# Patient Record
Sex: Female | Born: 1980
Health system: Southern US, Community
[De-identification: ages and names within clinical notes are randomized; demographics above are authoritative.]

## PROBLEM LIST (undated history)

## (undated) DIAGNOSIS — F32A Depression, unspecified: Secondary | ICD-10-CM

## (undated) DIAGNOSIS — T7840XA Allergy, unspecified, initial encounter: Secondary | ICD-10-CM

## (undated) DIAGNOSIS — J45909 Unspecified asthma, uncomplicated: Secondary | ICD-10-CM

## (undated) DIAGNOSIS — F419 Anxiety disorder, unspecified: Secondary | ICD-10-CM

## (undated) DIAGNOSIS — E785 Hyperlipidemia, unspecified: Secondary | ICD-10-CM

## (undated) DIAGNOSIS — G43909 Migraine, unspecified, not intractable, without status migrainosus: Secondary | ICD-10-CM

## (undated) DIAGNOSIS — Z8619 Personal history of other infectious and parasitic diseases: Secondary | ICD-10-CM

## (undated) DIAGNOSIS — F329 Major depressive disorder, single episode, unspecified: Secondary | ICD-10-CM

## (undated) HISTORY — DX: Allergy, unspecified, initial encounter: T78.40XA

## (undated) HISTORY — DX: Migraine, unspecified, not intractable, without status migrainosus: G43.909

## (undated) HISTORY — DX: Personal history of other infectious and parasitic diseases: Z86.19

## (undated) HISTORY — DX: Hyperlipidemia, unspecified: E78.5

## (undated) HISTORY — DX: Major depressive disorder, single episode, unspecified: F32.9

## (undated) HISTORY — DX: Unspecified asthma, uncomplicated: J45.909

## (undated) HISTORY — PX: WISDOM TOOTH EXTRACTION: SHX21

## (undated) HISTORY — DX: Depression, unspecified: F32.A

## (undated) HISTORY — DX: Anxiety disorder, unspecified: F41.9

---

## 2013-01-31 ENCOUNTER — Emergency Department (INDEPENDENT_AMBULATORY_CARE_PROVIDER_SITE_OTHER)
Admission: EM | Admit: 2013-01-31 | Discharge: 2013-01-31 | Disposition: A | Discharge status: Home / Self Care | Payer: Self-pay | Source: Home / Self Care | Attending: Family Medicine | Admitting: Family Medicine

## 2013-01-31 ENCOUNTER — Encounter (HOSPITAL_COMMUNITY): Payer: Self-pay | Admitting: Emergency Medicine

## 2013-01-31 DIAGNOSIS — M5431 Sciatica, right side: Secondary | ICD-10-CM

## 2013-01-31 DIAGNOSIS — M5430 Sciatica, unspecified side: Secondary | ICD-10-CM

## 2013-01-31 MED ORDER — TRAMADOL HCL 50 MG PO TABS: 50.0000 mg | ORAL_TABLET | Freq: Four times a day (QID) | ORAL | Status: DC | PRN

## 2013-01-31 MED ORDER — ONDANSETRON 4 MG PO TBDP: 4.0000 mg | ORAL_TABLET | Freq: Three times a day (TID) | ORAL | Status: DC | PRN

## 2013-01-31 MED ORDER — CYCLOBENZAPRINE HCL 10 MG PO TABS: 10.0000 mg | ORAL_TABLET | Freq: Three times a day (TID) | ORAL | Status: DC | PRN
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
## 2013-01-31 NOTE — Discharge Instructions (Signed)
Rest, avoid heavy lifting and strenuous exercise or activity until pain improves. Take medications as directed.Add Ibuprofen 800 mg three times a day for 4-5 days as well. Call Monday to arrange follow up with Dr Mardelle Matte for further evaluation of your injur and incidental Ct findings from [facility].    Sciatica Sciatica is pain, weakness, numbness, or tingling along your sciatic nerve. The nerve starts in the lower back and runs down the back of each leg. Nerve damage or certain conditions pinch or put pressure on the sciatic nerve. This causes the pain, weakness, and other discomforts of sciatica. HOME CARE   Only take medicine as told by your doctor.  Apply ice to the affected area for 20 minutes. Do this 3 4 times a day for the first 48 72 hours. Then try heat in the same way.  Exercise, stretch, or do your usual activities if these do not make your pain worse.  Go to physical therapy as told by your doctor.  Keep all doctor visits as told.  Do not wear high heels or shoes that are not supportive.  Get a firm mattress if your mattress is too soft to lessen pain and discomfort. GET HELP RIGHT AWAY IF:   You cannot control when you poop (bowel movement) or pee (urinate).  You have more weakness in your lower back, lower belly (pelvis), butt (buttocks), or legs.  You have redness or puffiness (swelling) of your back.  You have a burning feeling when you pee.  You have pain that gets worse when you lie down.  You have pain that wakes you from your sleep.  Your pain is worse than past pain.  Your pain lasts longer than 4 weeks.  You are suddenly losing weight without reason. MAKE SURE YOU:   Understand these instructions.  Will watch this condition.  Will get help right away if you are not doing well or get worse. Document Released: 09/27/2007 Document Revised: 06/19/2011 Document Reviewed: 04/29/2011 [facility] Patient Information 2014 [place].
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
## 2013-02-02 NOTE — ED Provider Notes (Signed)
Medical screening examination/treatment/procedure(s) were performed by a resident physician or non-physician practitioner and as the supervising physician I was immediately available for consultation/collaboration.  Lynne Leader, MD    Gregor Hams, MD 02/02/13 [phone]

## 2014-04-15 ENCOUNTER — Ambulatory Visit: Payer: Self-pay | Admitting: Physical Therapy

## 2014-04-21 ENCOUNTER — Encounter: Payer: Self-pay | Admitting: Physical Therapy

## 2014-04-21 ENCOUNTER — Ambulatory Visit: Payer: BLUE CROSS/BLUE SHIELD | Attending: Gynecology | Admitting: Physical Therapy

## 2014-04-21 DIAGNOSIS — F419 Anxiety disorder, unspecified: Secondary | ICD-10-CM | POA: Diagnosis not present

## 2014-04-21 DIAGNOSIS — N941 Dyspareunia: Secondary | ICD-10-CM | POA: Insufficient documentation

## 2014-04-21 DIAGNOSIS — M6289 Other specified disorders of muscle: Secondary | ICD-10-CM

## 2014-04-21 DIAGNOSIS — N8184 Pelvic muscle wasting: Secondary | ICD-10-CM | POA: Diagnosis present

## 2014-04-21 DIAGNOSIS — R29898 Other symptoms and signs involving the musculoskeletal system: Secondary | ICD-10-CM
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
## 2014-04-21 NOTE — Patient Instructions (Addendum)
Instruction and counseling in HEP and stress/anxiety management techniques:  1. Supine diaphragmatic breathing: lying on back with knees bent, one hand on chest the other on abdomen. Inhale belly rises, exhale gently falls.   Perform 5-10 minutes 1-2x/day 2. Supine hip ER stretch: lying on back with feet together and knees apart, exhale, contract pelvic floor and bring knees slightly closer together, inhale, let knees fall towards the ground.  Repeat for 10-20 breaths 1-2x/day.  3. Child's pose with wide knees and lateral rib breathing,  Repeat 10-20 breaths, 1-2x/day. 4. Legs up the wall with diaphragmatic breathing. Perform 5-10 minutes 1-2x/day.   Patient able to return demonstration with above stress management technique correctly

## 2014-04-26 ENCOUNTER — Encounter: Payer: Self-pay | Admitting: Physical Therapy

## 2014-04-26 ENCOUNTER — Ambulatory Visit: Payer: BLUE CROSS/BLUE SHIELD | Admitting: Physical Therapy

## 2014-04-26 DIAGNOSIS — N8184 Pelvic muscle wasting: Secondary | ICD-10-CM | POA: Diagnosis not present

## 2014-04-26 DIAGNOSIS — M6289 Other specified disorders of muscle: Secondary | ICD-10-CM

## 2014-04-26 DIAGNOSIS — R29898 Other symptoms and signs involving the musculoskeletal system: Secondary | ICD-10-CM

## 2014-04-26 NOTE — Patient Instructions (Addendum)
Instruction and counseling in HEP and stress/anxiety management techniques:  1. Supine diaphragmatic breathing: lying on back with knees bent, one hand on chest the other on abdomen. Inhale belly rises, exhale gently falls.   Perform 5-10 minutes 1-2x/day  Diaphragmatic Breathing - Supine   Copyright  VHI. All rights reserved.      2. Supine hip ER stretch: lying on back with feet together and knees apart, exhale, contract pelvic floor and bring knees slightly closer together, inhale, let knees fall towards the ground.  Repeat for 10-20 breaths 1-2x/day.  Butterfly, Supine   Copyright  VHI. All rights reserved.   3. Child's pose with wide knees and lateral rib breathing,  Repeat 10-20 breaths, 1-2x/day. BACK: Child's Pose (Sciatica)   Copyright  VHI. All rights reserved.   4. Legs up the wall with diaphragmatic breathing. Perform 5-10 minutes 1-2x/day. Have 2 legs on the wall Hamstrings / Gastrocnemius    Copyright  VHI. All rights reserved.  Chest Flexibility: Downward Dog Pose (Wall)   Standing with hands at shoulder height anchored on wall, step back into pose. Hold for _30___ breaths. Repeat __2__ times.  Piriformis Stretch, Sitting   Sit, one ankle on opposite knee, same-side hand on crossed knee. Push down on knee, keeping spine straight. Lean torso forward, with flat back, until tension is felt in hamstrings and gluteals of crossed-leg side. Hold _30__ seconds.  Repeat __2_ times per session. Do _1__ sessions per day. Both legs.  Copyright  VHI. All rights reserved.   On Elbows (Prone)   Rise up on elbows as high as possible, keeping hips on floor. Hold _30___ seconds. Repeat __2__ times per set. Do __1__ sets per session. Do _1___ sessions per day.  http://orth.exer.us/93   Copyright  VHI. All rights reserved.  STRETCHING THE PELVIC FLOOR MUSCLES NO DILATOR  Supplies . Vaginal lubricant . Mirror (optional) . Gloves  (optional) Positioning . Start in a semi-reclined position with your head propped up. Bend your knees and place your thumb or finger at the vaginal opening. Procedure . Apply a moderate amount of lubricant on the outer skin of your vagina, the labia minora.  Apply additional lubricant to your finger. Marland Kitchen Spread the skin away from the vaginal opening. Place the end of your finger at the opening. . Do a maximum contraction of the pelvic floor muscles. Tighten the vagina and the anus maximally and relax. . When you know they are relaxed, gently and slowly insert your finger into your vagina, directing your finger slightly downward, for 2-3 inches of insertion. . Relax and stretch the 6 o'clock position . Hold each stretch for _2 min__ and repeat __1_ time with rest breaks of _1__ seconds between each stretch. . Repeat the stretching in the 4 o'clock and 8 o'clock positions. . Total time should be _6__ minutes, _1__ x per day.  Note the amount of theme your were able to achieve and your tolerance to your finger in your vagina. Once you have accomplished the techniques you may try them in standing with one foot resting on the tub, or in other positions.  This is a good stretch to do in the shower if you don't need to use lubricant.  Also massage around the perineum, around the vagina, use to hand to stretch the area.    Patient able to return demonstration correctly with above exercises.

## 2014-04-26 NOTE — Therapy (Signed)
[facility] [address] [place], [place], [address] Phone: [phone]   Fax:  [phone]  Physical Therapy Treatment  Patient Details  Name: Jennifer Moody MRN: [id number] Date of Birth: 06/15/1980 Referring Provider:  West Pugh, NP  Encounter Date: 04/26/2014      PT End of Session - 04/26/14 1026    Visit Number 2   Date for PT Re-Evaluation 07/14/14   PT Start Time 0930   PT Stop Time 1022   PT Time Calculation (min) 52 min   Activity Tolerance Patient tolerated treatment well   Behavior During Therapy [facility] for tasks assessed/performed;Anxious      History reviewed. No pertinent past medical history.  History reviewed. No pertinent past surgical history.  There were no vitals filed for this visit.  Visit Diagnosis:  PFD (pelvic floor dysfunction)  Tight unbalanced muscles      Subjective Assessment - 04/26/14 0936    Subjective The stretches have been helping. I like the yoga poses. End of period pain with tampons with putting them in and taking out.    Pertinent History Anxiety   Patient Stated Goals reduce pain with intercourse   Currently in Pain? Yes   Pain Score 7    Pain Location Back   Pain Orientation Lower   Pain Descriptors / Indicators Aching;Dull  tightness in back of legs   Pain Type Chronic pain   Pain Onset More than a month ago   Aggravating Factors  intercourse, using a tampon, vaginal exam   Pain Relieving Factors no intercourse   Multiple Pain Sites No                         [facility] Adult PT Treatment/Exercise - 04/26/14 0001    Manual Therapy   Manual Therapy Joint mobilization;Massage   Joint Mobilization muscle energy to correct right anteriorly rotated ilium and sacrum rotated right.  leveled after correction   Massage to abdominal muscles, diaphragm, and bil. psoas                PT Education - 04/26/14 1025    Education provided  Yes   Education Details flexibility exercises, performing soft tissue work to the pelvic floor externally, having her boyfriend perform external soft tissue work.    Person(s) Educated Patient   Methods Explanation;Demonstration;Tactile cues;Verbal cues;Handout   Comprehension Returned demonstration;Verbalized understanding          PT Short Term Goals - 04/26/14 1031    PT SHORT TERM GOAL #1   Title understand  how to perform diaphragmatic breathing correclty without using accessory muscles.    Time 4   Period Weeks   Status On-going  uses chest breathing   PT SHORT TERM GOAL #2   Title understand how to perform self soft tissue work to the perineum   Time 4   Period Weeks   Status New  just learning how to perform   PT SHORT TERM GOAL #3   Title understand how to bulge the pelvic floor to improve relaxation   Time 4   Period Weeks   Status New  just started to learn   PT SHORT TERM GOAL #4   Title pelvis stay in correct position due to improve corestability   Time 4   Period Weeks   Status New  not corrected           PT Long Term Goals -  04/21/14 1343    PT LONG TERM GOAL #1   Title ability to have intercourse with 75% decreased in pain   Time 12   Period Weeks   Status New   PT LONG TERM GOAL #2   Title ability to wear a tampon with 75% decreased in pain   Time 12   Period Weeks   Status New   PT LONG TERM GOAL #3   Title understand how to use dilator to expand the vaginal canal to improve comfort with intercourse   Time 12   Period Weeks   Status New   PT LONG TERM GOAL #4   Title Marinoff scale is 1/3   Time 12   Period Weeks   Status New               Plan - 04/26/14 1027    Clinical Impression Statement Patient is a 34 year old female with pain in the low back and pelvic floor.  Patient is learning ways to manage her pain with stress relief techniques, flexiblity exercises and deep breathing.  patient reports the technique s are helping  her relax and reduce pain.  Patients pelvis was not in alignment and  was correctr with muscle energy techniques.  Patient diaphragm has decreased mobility with trigger points contributing to her pelvic floor. Patient shows decreased muscle control with bridges.    Pt will benefit from skilled therapeutic intervention in order to improve on the following deficits Pain;Decreased strength;Decreased mobility;Increased muscle spasms;Decreased activity tolerance;Increased fascial restricitons;Postural dysfunction;Decreased endurance;Decreased coordination;Decreased range of motion;Impaired flexibility   Rehab Potential Good   PT Frequency 1x / week   PT Duration 12 weeks   PT Treatment/Interventions Moist Heat;Therapeutic activities;Patient/family education;Passive range of motion;Therapeutic exercise;Biofeedback;Ultrasound;Manual techniques;Cryotherapy;Electrical Stimulation;Neuromuscular re-education;Functional mobility training   PT Next Visit Plan downtraining of pelvic floor, core contraction to improve pelvic stability, soft tissue work to diaphragm.    PT Home Exercise Plan current HEP   Consulted and Agree with Plan of Care Patient        Problem List There are no active problems to display for this patient.   Damisha Wolff,PT 04/26/2014, 10:36 AM  [facility] [address] [place], [place], [address] Phone: [phone]   Fax:  [phone]

## 2014-05-05 ENCOUNTER — Encounter: Payer: BLUE CROSS/BLUE SHIELD | Admitting: Physical Therapy

## 2014-05-13 ENCOUNTER — Ambulatory Visit: Payer: BLUE CROSS/BLUE SHIELD | Attending: Gynecology | Admitting: Physical Therapy

## 2014-05-13 ENCOUNTER — Encounter: Payer: Self-pay | Admitting: Physical Therapy

## 2014-05-13 DIAGNOSIS — F419 Anxiety disorder, unspecified: Secondary | ICD-10-CM | POA: Diagnosis not present

## 2014-05-13 DIAGNOSIS — M6289 Other specified disorders of muscle: Secondary | ICD-10-CM

## 2014-05-13 DIAGNOSIS — N941 Dyspareunia: Secondary | ICD-10-CM | POA: Diagnosis not present

## 2014-05-13 DIAGNOSIS — N8184 Pelvic muscle wasting: Secondary | ICD-10-CM | POA: Insufficient documentation

## 2014-05-13 DIAGNOSIS — R29898 Other symptoms and signs involving the musculoskeletal system: Secondary | ICD-10-CM | POA: Diagnosis not present

## 2014-05-13 NOTE — Therapy (Addendum)
[facility] [address] [place], [place], [address] Phone: [phone]   Fax:  [phone]  Physical Therapy Treatment  Patient Details  Name: Jennifer Moody MRN: [id number] Date of Birth: 19-Jul-1980 Referring Provider:  West Pugh, NP  Encounter Date: 05/13/2014      PT End of Session - 05/13/14 1232    Visit Number 3   Date for PT Re-Evaluation 07/14/14   PT Start Time 1230   PT Stop Time 1300   PT Time Calculation (min) 30 min   Activity Tolerance Patient tolerated treatment well   Behavior During Therapy [facility] for tasks assessed/performed;Anxious      History reviewed. No pertinent past medical history.  History reviewed. No pertinent past surgical history.  There were no vitals filed for this visit.  Visit Diagnosis:  PFD (pelvic floor dysfunction)  Tight unbalanced muscles      Subjective Assessment - 05/13/14 1233    Subjective I am feeling better. I was able to have intercourse without pain.    Pertinent History Anxiety   Limitations Other (comment)  wearing tampons and vaginal exam   Patient Stated Goals reduce pain with intercourse   Currently in Pain? No/denies   Multiple Pain Sites No            [facility] PT Assessment - 05/13/14 0001    Assessment   Medical Diagnosis Dyspareunia   Observation/Other Assessments   Focus on Therapeutic Outcomes (FOTO)  0% limitation   AROM   Lumbar Extension full   Palpation   Palpation pelvis in correct alignment   Pelvic Compression   Findings Negative     Physical therapist instructed patient on using a tampon that is smaller for the end of her cycle. PT instructed patient to use guided meditation and deep breathing exercises prior to her vaginal exam.  Physical therapist emailed patient a link for guided meditation for her to follow.               Pelvic Floor Special Questions - 05/13/14 0001    Marinoff Scale no problems                    PT Education - 05/13/14 1304    Education provided Yes   Education Details Instruction on using guided meditation, using a smaller  tampon at end of her cycle,    Person(s) Educated Patient   Methods Explanation;Other (comment)  website for guided meditation   Comprehension Verbalized understanding          PT Short Term Goals - 05/13/14 1239    PT SHORT TERM GOAL #1   Title understand  how to perform diaphragmatic breathing correclty without using accessory muscles.    Time 4   Period Weeks   Status Achieved   PT SHORT TERM GOAL #2   Title understand how to perform self soft tissue work to the perineum   Time 4   Period Weeks   Status Achieved   PT SHORT TERM GOAL #3   Title understand how to bulge the pelvic floor to improve relaxation   Time 4   Period Weeks   Status Achieved   PT SHORT TERM GOAL #4   Title pelvis stay in correct position due to improve corestability   Time 4   Period Weeks   Status Achieved           PT Long Term Goals - 05/13/14 1245  PT LONG TERM GOAL #1   Title ability to have intercourse with 75% decreased in pain   Time 12   Period Weeks   Status Achieved   PT LONG TERM GOAL #2   Title ability to wear a tampon with 75% decreased in pain   Time 12   Period Weeks   Status Not Met  50% less   PT LONG TERM GOAL #3   Title understand how to use dilator to expand the vaginal canal to improve comfort with intercourse   Time 12   Period Weeks   Status Deferred  does not need one   PT LONG TERM GOAL #4   Title Marinoff scale is 1/3   Time 12   Period Weeks   Status Achieved               Plan - 05/13/14 1250    Clinical Impression Statement Patient is a 34 year old female with pelvic and low back pain.  Patient is able to have intercourse without pain and uses her breathing techniques with longer foreplay.  Patient has met all of her LTG's. Patient is able to wear tampons with 50% less pain.   Patient has pain during the light part of her cycle due to the dryness of the tampon.  Patient has tried the menstraul cup but was uncomfortable. Patient is not in need of the dilator due to having intercourse without pain. FOTO score is 0% limitation.    Pt will benefit from skilled therapeutic intervention in order to improve on the following deficits Pain;Decreased strength;Decreased mobility;Increased muscle spasms;Decreased activity tolerance;Increased fascial restricitons;Postural dysfunction;Decreased endurance;Decreased coordination;Decreased range of motion;Impaired flexibility   Rehab Potential Good   PT Treatment/Interventions Moist Heat;Therapeutic activities;Patient/family education;Passive range of motion;Therapeutic exercise;Biofeedback;Ultrasound;Manual techniques;Cryotherapy;Electrical Stimulation;Neuromuscular re-education;Functional mobility training   PT Next Visit Plan Discharge to HEP   PT Home Exercise Plan current HEP   Consulted and Agree with Plan of Care Patient        Problem List There are no active problems to display for this patient.   Jennifer Moody,PT 05/13/2014, 1:12 PM  [facility] [address], [place], [place], [address] Phone: [phone]   Fax:  [phone]  PHYSICAL THERAPY DISCHARGE SUMMARY  Visits from Start of Care: 3  Current functional level related to goals / functional outcomes: See above.  Unable to fully assess patient due to not returning after last visit.   Did not achieve tampon goal due to being 50% comfortable.  Remaining deficits: See above.    Education / Equipment: HEP  Plan: Patient agrees to discharge.  Patient goals were partially met. Patient is being discharged due to not returning since the last visit. Thank you for the referral.Jennifer Moody, PT 11/24/2014 9:34 AM   ?????

## 2014-05-13 NOTE — Patient Instructions (Signed)
Bear Down   Exhaling, bear down as if to have a bowel movement. Repeat _3__ times. Do _1__ times a day.  Copyright  VHI. All rights reserved.

## 2015-03-02 ENCOUNTER — Emergency Department (HOSPITAL_COMMUNITY): Payer: BLUE CROSS/BLUE SHIELD

## 2015-03-02 ENCOUNTER — Emergency Department (HOSPITAL_COMMUNITY)
Admission: EM | Admit: 2015-03-02 | Discharge: 2015-03-02 | Disposition: A | Discharge status: Home / Self Care | Payer: BLUE CROSS/BLUE SHIELD | Attending: Emergency Medicine | Admitting: Emergency Medicine

## 2015-03-02 ENCOUNTER — Encounter (HOSPITAL_COMMUNITY): Payer: Self-pay | Admitting: Family Medicine

## 2015-03-02 DIAGNOSIS — Y998 Other external cause status: Secondary | ICD-10-CM | POA: Diagnosis not present

## 2015-03-02 DIAGNOSIS — Y9389 Activity, other specified: Secondary | ICD-10-CM | POA: Diagnosis not present

## 2015-03-02 DIAGNOSIS — R42 Dizziness and giddiness: Secondary | ICD-10-CM

## 2015-03-02 DIAGNOSIS — S0990XA Unspecified injury of head, initial encounter: Secondary | ICD-10-CM | POA: Diagnosis present

## 2015-03-02 DIAGNOSIS — R519 Headache, unspecified: Secondary | ICD-10-CM

## 2015-03-02 DIAGNOSIS — Y92410 Unspecified street and highway as the place of occurrence of the external cause: Secondary | ICD-10-CM | POA: Insufficient documentation

## 2015-03-02 DIAGNOSIS — Z79899 Other long term (current) drug therapy: Secondary | ICD-10-CM | POA: Diagnosis not present

## 2015-03-02 DIAGNOSIS — R51 Headache: Secondary | ICD-10-CM

## 2015-03-02 MED ORDER — NAPROXEN 375 MG PO TABS: 375.0000 mg | ORAL_TABLET | Freq: Two times a day (BID) | ORAL | Status: DC
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
## 2016-01-02 HISTORY — PX: MANDIBLE SURGERY: SHX707

## 2016-02-24 DIAGNOSIS — Z01419 Encounter for gynecological examination (general) (routine) without abnormal findings: Secondary | ICD-10-CM | POA: Diagnosis not present

## 2016-02-24 DIAGNOSIS — F419 Anxiety disorder, unspecified: Secondary | ICD-10-CM | POA: Diagnosis not present

## 2016-02-24 DIAGNOSIS — Z6828 Body mass index (BMI) 28.0-28.9, adult: Secondary | ICD-10-CM | POA: Diagnosis not present

## 2016-02-24 DIAGNOSIS — Z3041 Encounter for surveillance of contraceptive pills: Secondary | ICD-10-CM | POA: Diagnosis not present

## 2016-02-24 DIAGNOSIS — Z130 Encounter for screening for diseases of the blood and blood-forming organs and certain disorders involving the immune mechanism: Secondary | ICD-10-CM | POA: Diagnosis not present

## 2016-02-24 DIAGNOSIS — Z1389 Encounter for screening for other disorder: Secondary | ICD-10-CM | POA: Diagnosis not present

## 2016-04-13 DIAGNOSIS — F3181 Bipolar II disorder: Secondary | ICD-10-CM | POA: Diagnosis not present

## 2016-04-24 DIAGNOSIS — F3181 Bipolar II disorder: Secondary | ICD-10-CM | POA: Diagnosis not present

## 2016-05-15 DIAGNOSIS — F3181 Bipolar II disorder: Secondary | ICD-10-CM | POA: Diagnosis not present

## 2016-05-30 DIAGNOSIS — F603 Borderline personality disorder: Secondary | ICD-10-CM | POA: Diagnosis not present

## 2016-06-05 DIAGNOSIS — F603 Borderline personality disorder: Secondary | ICD-10-CM | POA: Diagnosis not present

## 2016-06-14 DIAGNOSIS — F603 Borderline personality disorder: Secondary | ICD-10-CM | POA: Diagnosis not present

## 2016-06-21 DIAGNOSIS — Z79899 Other long term (current) drug therapy: Secondary | ICD-10-CM | POA: Diagnosis not present

## 2016-06-21 DIAGNOSIS — F13239 Sedative, hypnotic or anxiolytic dependence with withdrawal, unspecified: Secondary | ICD-10-CM | POA: Diagnosis present

## 2016-06-22 ENCOUNTER — Encounter (HOSPITAL_COMMUNITY): Payer: Self-pay | Admitting: Emergency Medicine

## 2016-06-22 ENCOUNTER — Emergency Department (HOSPITAL_COMMUNITY)
Admission: EM | Admit: 2016-06-22 | Discharge: 2016-06-22 | Disposition: A | Discharge status: Home / Self Care | Payer: BLUE CROSS/BLUE SHIELD | Attending: Emergency Medicine | Admitting: Emergency Medicine

## 2016-06-22 DIAGNOSIS — F13939 Sedative, hypnotic or anxiolytic use, unspecified with withdrawal, unspecified: Secondary | ICD-10-CM

## 2016-06-22 DIAGNOSIS — F13239 Sedative, hypnotic or anxiolytic dependence with withdrawal, unspecified: Secondary | ICD-10-CM

## 2016-06-22 LAB — COMPREHENSIVE METABOLIC PANEL
ALBUMIN: 3.9 g/dL (ref 3.5–5.0)
ALT: 18 U/L (ref 14–54)
AST: 21 U/L (ref 15–41)
Alkaline Phosphatase: 35 U/L — ABNORMAL LOW (ref 38–126)
Anion gap: 10 (ref 5–15)
BUN: 8 mg/dL (ref 6–20)
CHLORIDE: 108 mmol/L (ref 101–111)
CO2: 21 mmol/L — AB (ref 22–32)
Calcium: 9.2 mg/dL (ref 8.9–10.3)
Creatinine, Ser: 0.7 mg/dL (ref 0.44–1.00)
GFR calc Af Amer: >60 mL/min (ref >60)
GFR calc non Af Amer: >60 mL/min (ref >60)
Glucose, Bld: 103 mg/dL — ABNORMAL HIGH (ref 65–99)
POTASSIUM: 3.7 mmol/L (ref 3.5–5.1)
SODIUM: 139 mmol/L (ref 135–145)
Total Bilirubin: 0.6 mg/dL (ref 0.3–1.2)
Total Protein: 7 g/dL (ref 6.5–8.1)

## 2016-06-22 LAB — URINALYSIS, ROUTINE W REFLEX MICROSCOPIC
Bacteria, UA: NONE SEEN
Bilirubin Urine: NEGATIVE
Glucose, UA: NEGATIVE mg/dL
Ketones, ur: 5 mg/dL — AB
Leukocytes, UA: NEGATIVE
Nitrite: NEGATIVE
Protein, ur: NEGATIVE mg/dL
SQUAMOUS EPITHELIAL / LPF: NONE SEEN
Specific Gravity, Urine: 1.011 (ref 1.005–1.030)
pH: 5 (ref 5.0–8.0)

## 2016-06-22 LAB — CBC
HEMATOCRIT: 42.3 % (ref 36.0–46.0)
Hemoglobin: 14 g/dL (ref 12.0–15.0)
MCH: 30.1 pg (ref 26.0–34.0)
MCHC: 33.1 g/dL (ref 30.0–36.0)
MCV: 91 fL (ref 78.0–100.0)
Platelets: 361 10*3/uL (ref 150–400)
RBC: 4.65 MIL/uL (ref 3.87–5.11)
RDW: 13.3 % (ref 11.5–15.5)
WBC: 9.5 10*3/uL (ref 4.0–10.5)

## 2016-06-22 LAB — LIPASE, BLOOD: LIPASE: 39 U/L (ref 11–51)

## 2016-06-22 MED ORDER — NAPROXEN 250 MG PO TABS
500.0000 mg | ORAL_TABLET | Freq: Once | ORAL | Status: AC
  Administered 2016-06-22: 500 mg via ORAL
  Filled 2016-06-22: qty 2

## 2016-06-22 MED ORDER — KETOROLAC TROMETHAMINE 15 MG/ML IJ SOLN: 15.0000 mg | Freq: Once | INTRAMUSCULAR | Status: DC

## 2016-06-22 MED ORDER — DIPHENHYDRAMINE HCL 25 MG PO CAPS
50.0000 mg | ORAL_CAPSULE | Freq: Once | ORAL | Status: AC
  Administered 2016-06-22: 50 mg via ORAL
  Filled 2016-06-22: qty 2

## 2016-06-22 MED ORDER — DIAZEPAM 5 MG PO TABS
5.0000 mg | ORAL_TABLET | Freq: Once | ORAL | Status: AC
  Administered 2016-06-22: 5 mg via ORAL
  Filled 2016-06-22: qty 1

## 2016-06-22 MED ORDER — QUETIAPINE FUMARATE 25 MG PO TABS: 25.0000 mg | ORAL_TABLET | Freq: Every day | ORAL | 0 refills | Status: DC

## 2016-06-22 MED ORDER — METOCLOPRAMIDE HCL 10 MG PO TABS
10.0000 mg | ORAL_TABLET | Freq: Once | ORAL | Status: AC
  Administered 2016-06-22: 10 mg via ORAL
  Filled 2016-06-22: qty 1

## 2016-06-22 MED ORDER — METOCLOPRAMIDE HCL 5 MG/ML IJ SOLN: 10.0000 mg | Freq: Once | INTRAMUSCULAR | Status: DC

## 2016-06-22 MED ORDER — METHOCARBAMOL 500 MG PO TABS: 500.0000 mg | ORAL_TABLET | Freq: Two times a day (BID) | ORAL | 0 refills | Status: DC

## 2016-06-22 NOTE — Discharge Instructions (Signed)
Please try to wean off of Seroquel. See your primary doctor for optimal care.

## 2016-06-22 NOTE — ED Triage Notes (Signed)
Pt presents to ED for assessment of multiple symptoms starting after taking her last dose of Seroquel (100mg ).  Patient states she did not wean.  Patient states she is having headaches, joint and muscle pain, abdominal pain, vomiting, nausea, diarrhea, weakness, dizziness.

## 2016-06-22 NOTE — ED Provider Notes (Addendum)
[facility] DEPT Provider Note   CSN: [id number] Arrival date & time: 06/21/16  2340 By signing my name below, I, Dyke Brackett, attest that this documentation has been prepared under the direction and in the presence of Varney Biles, MD . Electronically Signed: Dyke Brackett, Scribe. 06/22/2016. 2:03 AM.   History   Chief Complaint Chief Complaint  Patient presents with  . multiple complaints  . medication withdrawal    HPI Jennifer Moody is a 36 y.o. female who presents to the Emergency Department with multiple complaints onset four days ago. She reports intermittent headache, nausea, vomiting, cramping abdominal pain, belching, palpitations, arthralgias, myalgias, and insomnia. Pt feels as if she is having withdrawal symptoms from her 100 mg/day dose of Seroquel which she stopped taking five days ago. She stopped taking this medication abruptly. Pt had expressed to her psychiatrist that she wanted to stop taking Seroquel due to side effects, and she did not refill her prescription when she finished it five days ago.  Per pt, she has been on Seroquel for 2 months and has been off of this once during that time. She states she previously had a syncopal episode after her prescription was increased to 150 mg. She has taken migraine medication and 3 Unisom with no relief of sleep disturbance. No alleviating factors noted. Pt also reports that she has a cyst in her jaw from a childhood trauma for which she is following with an oral surgeon; pt is concerned that this cyst may have contributed to her current symptoms. No hx of heavy alcohol use or drug abuse. Pt currently takes hormonal birth control pills, vitamins and fish oil. She denies any fever.   The history is provided by the patient. No language interpreter was used.    History reviewed. No pertinent past medical history.  There are no active problems to display for this patient.   History reviewed. No pertinent surgical  history.  OB History    No data available     Home Medications    Prior to Admission medications   Medication Sig Start Date End Date Taking? Authorizing Provider  ALPRAZolam Duanne Moron) 0.5 MG tablet Take 0.25 mg by mouth at bedtime as needed for anxiety.    [provider]  baclofen (LIORESAL) 10 MG tablet Take 10 mg by mouth 2 (two) times daily as needed. 02/08/15   [provider]  cyclobenzaprine (FLEXERIL) 10 MG tablet Take 1 tablet (10 mg total) by mouth 3 (three) times daily as needed for muscle spasms (and or pain). 01/31/13   Schorr, Rhetta Mura, NP  methocarbamol (ROBAXIN) 500 MG tablet Take 1 tablet (500 mg total) by mouth 2 (two) times daily. 06/22/16   Varney Biles, MD  MINASTRIN 24 FE 1-20 MG-MCG(24) CHEW Chew 1 each by mouth daily. 02/11/15   [provider]  naproxen (NAPROSYN) 375 MG tablet Take 1 tablet (375 mg total) by mouth 2 (two) times daily. 03/02/15   Harris, Vernie Shanks, PA-C  ondansetron (ZOFRAN ODT) 4 MG disintegrating tablet Take 1 tablet (4 mg total) by mouth every 8 (eight) hours as needed for nausea or vomiting. 01/31/13   Schorr, Rhetta Mura, NP  QUEtiapine (SEROQUEL) 25 MG tablet Take 1-2 tablets (25-50 mg total) by mouth at bedtime. Take 50 mg for 5 days followed by 25 mh for 5 days. 06/22/16   Varney Biles, MD  traMADol (ULTRAM) 50 MG tablet Take 1 tablet (50 mg total) by mouth every 6 (six) hours as needed for moderate pain  or severe pain. 01/31/13   Schorr, Rhetta Mura, NP  zonisamide (ZONEGRAN) 100 MG capsule Take 100 mg by mouth at bedtime. 02/08/15   [provider]    Family History History reviewed. No pertinent family history.  Social History Social History  Substance Use Topics  . Smoking status: Never Smoker  . Smokeless tobacco: Never Used  . Alcohol use Yes     Comment: socially     Allergies   Cephalosporins; Imitrex [sumatriptan]; and Hydrocodone   Review of Systems Review of Systems All systems reviewed  and are negative for acute change except as noted in the HPI.  Physical Exam Updated Vital Signs BP 112/82 (BP Location: Right Arm)   Pulse 95   Temp 98.2 F (36.8 C) (Oral)   Resp 16   SpO2 99%   Physical Exam  Constitutional: She is oriented to person, place, and time. She appears well-developed and well-nourished. No distress.  HENT:  Head: Normocephalic and atraumatic.  Eyes: Conjunctivae are normal.  Cardiovascular: Normal rate.   Pulmonary/Chest: Effort normal.  Abdominal: She exhibits no distension.  Neurological: She is alert and oriented to person, place, and time.  Skin: Skin is warm and dry.  Nursing note and vitals reviewed.  ED Treatments / Results  DIAGNOSTIC STUDIES:  Oxygen Saturation is 99% on RA, normal by my interpretation.    COORDINATION OF CARE:  1:41 AM Discussed treatment plan with pt at bedside and pt agreed to plan.   Labs (all labs ordered are listed, but only abnormal results are displayed) Labs Reviewed  URINALYSIS, ROUTINE W REFLEX MICROSCOPIC - Abnormal; Notable for the following:       Result Value   Hgb urine dipstick SMALL (*)    Ketones, ur 5 (*)    All other components within normal limits  COMPREHENSIVE METABOLIC PANEL - Abnormal; Notable for the following:    CO2 21 (*)    Glucose, Bld 103 (*)    Alkaline Phosphatase 35 (*)    All other components within normal limits  LIPASE, BLOOD  CBC    EKG  EKG Interpretation None       Radiology No results found.  Procedures Procedures (including critical care time)  Medications Ordered in ED Medications  diazepam (VALIUM) tablet 5 mg (not administered)  naproxen (NAPROSYN) tablet 500 mg (500 mg Oral Given 06/22/16 0149)  diphenhydrAMINE (BENADRYL) capsule 50 mg (50 mg Oral Given 06/22/16 0149)  metoCLOPramide (REGLAN) tablet 10 mg (10 mg Oral Given 06/22/16 0149)     Initial Impression / Assessment and Plan / ED Course  I have reviewed the triage vital signs and the  nursing notes.  Pertinent labs & imaging results that were available during my care of the patient were reviewed by me and considered in my medical decision making (see chart for details).    Pt comes in with multiple complains - arthralgias, insomnia, stomach spasms, poor appetite. All the symptoms started after she abruptly stopped Seroquel qhs for insomnia abd depression. Pt doesn't want symptom management-  Mainly because she wants to be able to sleep, and instead prefers tapering off of the Seroquel, so we will give her a 25 mg dose rx.  Pt has no fevers, chills, sweats, cough, bloody stools, uti like symptoms. No substance abuse hx.  Final Clinical Impressions(s) / ED Diagnoses   Final diagnoses:  Withdrawal from sedative, hypnotic, or anxiolytic drug ([place])    New Prescriptions New Prescriptions   METHOCARBAMOL (ROBAXIN) 500 MG  TABLET    Take 1 tablet (500 mg total) by mouth 2 (two) times daily.   QUETIAPINE (SEROQUEL) 25 MG TABLET    Take 1-2 tablets (25-50 mg total) by mouth at bedtime. Take 50 mg for 5 days followed by 25 mh for 5 days.   I personally performed the services described in this documentation, which was scribed in my presence. The recorded information has been reviewed and is accurate.    Varney Biles, MD 06/22/16 [place], [place], MD 06/22/16 [phone]

## 2016-08-01 DIAGNOSIS — D165 Benign neoplasm of lower jaw bone: Secondary | ICD-10-CM | POA: Diagnosis not present

## 2016-08-08 ENCOUNTER — Other Ambulatory Visit (HOSPITAL_COMMUNITY): Payer: Self-pay | Admitting: Dentistry

## 2016-08-08 DIAGNOSIS — D165 Benign neoplasm of lower jaw bone: Secondary | ICD-10-CM

## 2016-08-09 ENCOUNTER — Other Ambulatory Visit (HOSPITAL_COMMUNITY): Payer: Self-pay | Admitting: Dentistry

## 2016-08-09 DIAGNOSIS — D165 Benign neoplasm of lower jaw bone: Secondary | ICD-10-CM

## 2016-08-16 ENCOUNTER — Ambulatory Visit (HOSPITAL_COMMUNITY)
Admission: RE | Admit: 2016-08-16 | Discharge: 2016-08-16 | Disposition: A | Discharge status: Home / Self Care | Payer: BLUE CROSS/BLUE SHIELD | Source: Ambulatory Visit | Attending: Dentistry | Admitting: Dentistry

## 2016-08-16 DIAGNOSIS — D165 Benign neoplasm of lower jaw bone: Secondary | ICD-10-CM | POA: Diagnosis not present

## 2016-08-16 DIAGNOSIS — K032 Erosion of teeth: Secondary | ICD-10-CM | POA: Insufficient documentation

## 2016-08-16 DIAGNOSIS — M2740 Unspecified cyst of jaw: Secondary | ICD-10-CM | POA: Diagnosis not present

## 2016-08-16 MED ORDER — IOPAMIDOL (ISOVUE-300) INJECTION 61%
INTRAVENOUS | Status: AC
Start: 2016-08-16 — End: 2016-08-16
  Administered 2016-08-16: 75 mL
  Filled 2016-08-16: qty 75

## 2016-09-06 DIAGNOSIS — D165 Benign neoplasm of lower jaw bone: Secondary | ICD-10-CM | POA: Diagnosis not present

## 2016-09-10 DIAGNOSIS — D165 Benign neoplasm of lower jaw bone: Secondary | ICD-10-CM | POA: Diagnosis not present

## 2016-10-18 DIAGNOSIS — D165 Benign neoplasm of lower jaw bone: Secondary | ICD-10-CM | POA: Diagnosis not present

## 2016-10-28 ENCOUNTER — Ambulatory Visit (HOSPITAL_COMMUNITY)
Admission: EM | Admit: 2016-10-28 | Discharge: 2016-10-28 | Disposition: A | Discharge status: Home / Self Care | Payer: BLUE CROSS/BLUE SHIELD | Attending: Internal Medicine | Admitting: Internal Medicine

## 2016-10-28 ENCOUNTER — Encounter (HOSPITAL_COMMUNITY): Payer: Self-pay | Admitting: Emergency Medicine

## 2016-10-28 DIAGNOSIS — W5503XA Scratched by cat, initial encounter: Secondary | ICD-10-CM

## 2016-10-28 DIAGNOSIS — S0502XA Injury of conjunctiva and corneal abrasion without foreign body, left eye, initial encounter: Secondary | ICD-10-CM

## 2016-10-28 MED ORDER — TETRACAINE HCL 0.5 % OP SOLN
OPHTHALMIC | Status: AC
  Filled 2016-10-28: qty 4

## 2016-10-28 MED ORDER — TETANUS-DIPHTH-ACELL PERTUSSIS 5-2.5-18.5 LF-MCG/0.5 IM SUSP
0.5000 mL | Freq: Once | INTRAMUSCULAR | Status: AC
  Administered 2016-10-28: 0.5 mL via INTRAMUSCULAR

## 2016-10-28 MED ORDER — TETANUS-DIPHTH-ACELL PERTUSSIS 5-2.5-18.5 LF-MCG/0.5 IM SUSP
INTRAMUSCULAR | Status: AC
  Filled 2016-10-28: qty 0.5

## 2016-10-28 MED ORDER — TETRACAINE HCL 0.5 % OP SOLN
1.0000 [drp] | Freq: Once | OPHTHALMIC | Status: AC
  Administered 2016-10-28: 1 [drp] via OPHTHALMIC

## 2016-10-28 MED ORDER — GENTAMICIN SULFATE 0.3 % OP SOLN: OPHTHALMIC | 0 refills | Status: DC

## 2016-10-28 MED ORDER — TRAMADOL HCL 50 MG PO TABS: 50.0000 mg | ORAL_TABLET | Freq: Four times a day (QID) | ORAL | 0 refills | Status: DC | PRN

## 2016-10-28 MED ORDER — AMOXICILLIN-POT CLAVULANATE 875-125 MG PO TABS: 1.0000 | ORAL_TABLET | Freq: Two times a day (BID) | ORAL | 0 refills | Status: DC

## 2016-10-28 NOTE — ED Triage Notes (Signed)
Pt here for left eye pain after kitten scratched her in eye today

## 2016-10-28 NOTE — ED Provider Notes (Signed)
[facility]    CSN: [id number] Arrival date & time: 10/28/16  2353     History   Chief Complaint Chief Complaint  Patient presents with  . Eye Pain    HPI Lunabella Badgett is a 36 y.o. female.   HPI Niamya Vittitow is a 36 y.o. female presenting to UC with c/o 10/10 Left eye pain that started early this morning after her kitten scratched her eye.  Pain was mild at first but worsened throughout the day.  Pain is burning.  Photophobia present.  Denies other injuries. Last tetanus over 10 years ago.  She does not wear contacts.     History reviewed. No pertinent past medical history.  There are no active problems to display for this patient.   History reviewed. No pertinent surgical history.  OB History    No data available       Home Medications    Prior to Admission medications   Medication Sig Start Date End Date Taking? Authorizing Provider  ALPRAZolam Duanne Moron) 0.5 MG tablet Take 0.25 mg by mouth at bedtime as needed for anxiety.    [provider]  amoxicillin-clavulanate (AUGMENTIN) 875-125 MG tablet Take 1 tablet by mouth 2 (two) times daily. One po bid x 7 days 10/28/16   Noe Gens, PA-C  baclofen (LIORESAL) 10 MG tablet Take 10 mg by mouth 2 (two) times daily as needed. 02/08/15   [provider]  cyclobenzaprine (FLEXERIL) 10 MG tablet Take 1 tablet (10 mg total) by mouth 3 (three) times daily as needed for muscle spasms (and or pain). 01/31/13   Schorr, Rhetta Mura, NP  gentamicin (GARAMYCIN) 0.3 % ophthalmic solution Apply 2 drops every 4-6 hours for 7 days 10/28/16   Noe Gens, PA-C  methocarbamol (ROBAXIN) 500 MG tablet Take 1 tablet (500 mg total) by mouth 2 (two) times daily. 06/22/16   Varney Biles, MD  MINASTRIN 24 FE 1-20 MG-MCG(24) CHEW Chew 1 each by mouth daily. 02/11/15   [provider]  naproxen (NAPROSYN) 375 MG tablet Take 1 tablet (375 mg total) by mouth 2 (two) times daily. 03/02/15   Harris, Vernie Shanks,  PA-C  ondansetron (ZOFRAN ODT) 4 MG disintegrating tablet Take 1 tablet (4 mg total) by mouth every 8 (eight) hours as needed for nausea or vomiting. 01/31/13   Schorr, Rhetta Mura, NP  QUEtiapine (SEROQUEL) 25 MG tablet Take 1-2 tablets (25-50 mg total) by mouth at bedtime. Take 50 mg for 5 days followed by 25 mh for 5 days. 06/22/16   Varney Biles, MD  traMADol (ULTRAM) 50 MG tablet Take 1 tablet (50 mg total) by mouth every 6 (six) hours as needed for moderate pain or severe pain. 10/28/16   Noe Gens, PA-C  zonisamide (ZONEGRAN) 100 MG capsule Take 100 mg by mouth at bedtime. 02/08/15   [provider]    Family History History reviewed. No pertinent family history.  Social History Social History  Substance Use Topics  . Smoking status: Never Smoker  . Smokeless tobacco: Never Used  . Alcohol use Yes     Comment: socially     Allergies   Cephalosporins; Imitrex [sumatriptan]; and Hydrocodone   Review of Systems Review of Systems  Eyes: Positive for photophobia, pain, discharge (watery), redness and visual disturbance ( hard to open eye due to pain). Negative for itching.  Neurological: Negative for dizziness, light-headedness and headaches.     Physical Exam Triage Vital Signs ED Triage Vitals  Enc  Vitals Group     BP 10/28/16 1907 (!) 154/99     Pulse Rate 10/28/16 1907 (!) 120     Resp 10/28/16 1907 18     Temp 10/28/16 1907 97.9 F (36.6 C)     Temp Source 10/28/16 1907 Oral     SpO2 10/28/16 1907 98 %     Weight --      Height --      Head Circumference --      Peak Flow --      Pain Score 10/28/16 1908 10     Pain Loc --      Pain Edu? --      Excl. in [place]? --    No data found.   Updated Vital Signs BP (!) 154/99 (BP Location: Right Arm)   Pulse (!) 120   Temp 97.9 F (36.6 C) (Oral)   Resp 18   SpO2 98%   Visual Acuity Right Eye Distance: (S) 20/20 w/o corrections Left Eye Distance: (S) 20/30 w/o corrections.  Bilateral  Distance: (S) 20/20 w/o corrections  Right Eye Near:   Left Eye Near:    Bilateral Near:     Physical Exam  Constitutional: She is oriented to person, place, and time. She appears well-developed and well-nourished. No distress.  HENT:  Head: Normocephalic and atraumatic.  Eyes: Pupils are equal, round, and reactive to light. EOM and lids are normal. Lids are everted and swept, no foreign bodies found. Left eye exhibits no discharge. No foreign body present in the left eye. Left conjunctiva is injected ( mild).    Left eye: corneal abrasion.  No ulceration. No bleeding or discharge in or around the eye.   Neck: Normal range of motion.  Cardiovascular: Normal rate.   Pulmonary/Chest: Effort normal.  Musculoskeletal: Normal range of motion.  Neurological: She is alert and oriented to person, place, and time.  Skin: Skin is warm and dry. She is not diaphoretic.  Psychiatric: She has a normal mood and affect. Her behavior is normal.  Nursing note and vitals reviewed.    UC Treatments / Results  Labs (all labs ordered are listed, but only abnormal results are displayed) Labs Reviewed - No data to display  EKG  EKG Interpretation None       Radiology No results found.  Procedures Procedures (including critical care time)  Medications Ordered in UC Medications  tetracaine (PONTOCAINE) 0.5 % ophthalmic solution 1 drop (1 drop Left Eye Given by Other 10/28/16 1958)  Tdap (BOOSTRIX) injection 0.5 mL (0.5 mLs Intramuscular Given 10/28/16 1955)     Initial Impression / Assessment and Plan / UC Course  I have reviewed the triage vital signs and the nursing notes.  Pertinent labs & imaging results that were available during my care of the patient were reviewed by me and considered in my medical decision making (see chart for details).     Corneal abrasion Left eye from cat scratch. Tdap updated tonight. Will start pt on gentamicin ophthalmic drops and oral Augmentin  F/u  with eye specialist this week for recheck  Tramadol prescribed for severe pain. Advised not to drive or drink alcohol while taking due to risk of drowsiness. Pt has taken before w/o issue.   Final Clinical Impressions(s) / UC Diagnoses   Final diagnoses:  Left corneal abrasion, initial encounter  Cat scratch    New Prescriptions Discharge Medication List as of 10/28/2016  7:57 PM    START taking these medications  Details  amoxicillin-clavulanate (AUGMENTIN) 875-125 MG tablet Take 1 tablet by mouth 2 (two) times daily. One po bid x 7 days, Starting Sun 10/28/2016, Normal    gentamicin (GARAMYCIN) 0.3 % ophthalmic solution Apply 2 drops every 4-6 hours for 7 days, Normal         Controlled Substance Prescriptions [place] Controlled Substance Registry consulted? Not Applicable   Tyrell Antonio 10/28/16 2008

## 2016-10-28 NOTE — Discharge Instructions (Signed)
°  Tramadol is strong pain medication. While taking, do not drink alcohol, drive, or perform any other activities that requires focus while taking these medications.  ° °

## 2016-12-04 DIAGNOSIS — Z885 Allergy status to narcotic agent status: Secondary | ICD-10-CM | POA: Diagnosis not present

## 2016-12-04 DIAGNOSIS — M26622 Arthralgia of left temporomandibular joint: Secondary | ICD-10-CM | POA: Diagnosis not present

## 2016-12-04 DIAGNOSIS — Z881 Allergy status to other antibiotic agents status: Secondary | ICD-10-CM | POA: Diagnosis not present

## 2016-12-04 DIAGNOSIS — D165 Benign neoplasm of lower jaw bone: Secondary | ICD-10-CM | POA: Diagnosis not present

## 2016-12-04 DIAGNOSIS — Z888 Allergy status to other drugs, medicaments and biological substances status: Secondary | ICD-10-CM | POA: Diagnosis not present

## 2016-12-04 DIAGNOSIS — F419 Anxiety disorder, unspecified: Secondary | ICD-10-CM | POA: Diagnosis not present

## 2016-12-04 DIAGNOSIS — R51 Headache: Secondary | ICD-10-CM | POA: Diagnosis not present

## 2016-12-05 DIAGNOSIS — Z888 Allergy status to other drugs, medicaments and biological substances status: Secondary | ICD-10-CM | POA: Diagnosis not present

## 2016-12-05 DIAGNOSIS — R51 Headache: Secondary | ICD-10-CM | POA: Diagnosis not present

## 2016-12-05 DIAGNOSIS — D165 Benign neoplasm of lower jaw bone: Secondary | ICD-10-CM | POA: Diagnosis not present

## 2016-12-05 DIAGNOSIS — F419 Anxiety disorder, unspecified: Secondary | ICD-10-CM | POA: Diagnosis not present

## 2016-12-05 DIAGNOSIS — Z881 Allergy status to other antibiotic agents status: Secondary | ICD-10-CM | POA: Diagnosis not present

## 2016-12-05 DIAGNOSIS — Z885 Allergy status to narcotic agent status: Secondary | ICD-10-CM | POA: Diagnosis not present

## 2016-12-05 DIAGNOSIS — M26622 Arthralgia of left temporomandibular joint: Secondary | ICD-10-CM | POA: Diagnosis not present

## 2017-02-21 ENCOUNTER — Other Ambulatory Visit: Payer: Self-pay | Admitting: *Deleted

## 2017-02-21 ENCOUNTER — Encounter: Payer: Self-pay | Admitting: Physician Assistant

## 2017-02-21 ENCOUNTER — Ambulatory Visit (INDEPENDENT_AMBULATORY_CARE_PROVIDER_SITE_OTHER): Payer: BLUE CROSS/BLUE SHIELD | Admitting: Physician Assistant

## 2017-02-21 VITALS — BP 124/80 | HR 110 | Temp 98.2°F | Ht 64.5 in | Wt 158.0 lb

## 2017-02-21 DIAGNOSIS — Z87898 Personal history of other specified conditions: Secondary | ICD-10-CM | POA: Diagnosis not present

## 2017-02-21 DIAGNOSIS — H9202 Otalgia, left ear: Secondary | ICD-10-CM

## 2017-02-21 DIAGNOSIS — Z7689 Persons encountering health services in other specified circumstances: Secondary | ICD-10-CM | POA: Diagnosis not present

## 2017-02-21 NOTE — Patient Instructions (Signed)
It was great to meet you!  Call Dr. Pollie Friar office and let them know I am putting in a referral for you to be seen. This is a ENT doctor.  Take Sudafed as needed to help with congestion.  Keep ears covered when out in the cold.  Make an appointment for a physical with [place] at your convenience.  Consider putting your mupirocin ointment at the outer edge of the inside of your nose.

## 2017-02-21 NOTE — Progress Notes (Signed)
Jennifer Moody is a 37 y.o. female here to [facility].  I acted as a Education administrator for [employer], PA-C Anselmo Pickler, LPN  History of Present Illness:   Chief Complaint  Patient presents with  . Establish Care    BC/BS  . Left ear pain    Acute Concerns: Ear pain and recent nosebleeds-- has had ear pain since jaw surgery in December 2018. Slight cough -- dry. No fever. Is bothered by cold air. No drainage from ear. Intermittent, dull pain is a 4/10. Not taking anything for the pain, usually resolves when going inside to a warm room. Re-check with the surgeon next week for her jaw surgery. This will be her second re-check -- first one went well, no abnormalities seen. Is able to eat normal foods for the most part. No issues with vertigo or dizziness. Constant ringing in ears -- reports that this was present prior to surgery. Uses sound machine to sleep. Nasal drainage is clear. Used to use Afrin, but now uses Flonase regularly. After her surgery, was taking Tylenol, Ibuprofen, Tramadol for pain. Now doesn't really take anything for pain. She has also been getting recent nosebleeds, which she suspects is from the trauma of having a tube in her nose during her mandibular surgery. No excessive nosebleeds requiring medical attention.  Health Maintenance: Immunizations -- up to date PAP -- seeing ob-gyn soon, will request records Weight -- Weight: 158 lb (71.7 kg)   Depression screen PHQ 2/9 02/21/2017  Decreased Interest 1  Down, Depressed, Hopeless 0  PHQ - 2 Score 1  Altered sleeping 3  Tired, decreased energy 2  Change in appetite 1  Feeling bad or failure about yourself  0  Trouble concentrating 1  Moving slowly or fidgety/restless 1  Suicidal thoughts 0  PHQ-9 Score 9  Difficult doing work/chores Somewhat difficult    GAD 7 : Generalized Anxiety Score 02/21/2017  Nervous, Anxious, on Edge 1  Control/stop worrying 1  Worry too much - different things 0  Trouble relaxing 2   Restless 1  Easily annoyed or irritable 1  Afraid - awful might happen 0  Total GAD 7 Score 6   Other providers/specialists: Ob-Gyn -- [facility] Psych -- [facility] x 6 weeks, no longer goes; did see therapist at one point; now talks to her pastor Dentist -- Dr. Patrina Levering  Past Medical History:  Diagnosis Date  . Allergy   . Anxiety   . Asthma    seasonal allergies  . Depression   . History of chicken pox   . Migraines      Social History   Socioeconomic History  . Marital status: Single    Spouse name: Not on file  . Number of children: Not on file  . Years of education: Not on file  . Highest education level: Not on file  Social Needs  . Financial resource strain: Not on file  . Food insecurity - worry: Not on file  . Food insecurity - inability: Not on file  . Transportation needs - medical: Not on file  . Transportation needs - non-medical: Not on file  Occupational History  . Not on file  Tobacco Use  . Smoking status: Never Smoker  . Smokeless tobacco: Never Used  Substance and Sexual Activity  . Alcohol use: Yes    Comment: socially glass of wine  . Drug use: No  . Sexual activity: No  Other Topics Concern  . 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 MANDIBLE SURGERY Left 2018   Removal of Benign tumors  . WISDOM TOOTH EXTRACTION Bilateral     Family History  Problem Relation Age of Onset  . Arthritis Mother   . High Cholesterol Mother   . High blood pressure Mother   . High Cholesterol Father   . Hypertension Father   . Birth defects Sister   . Mental retardation Sister   . Arthritis Maternal Grandmother   . Depression Maternal Grandmother   . Heart disease Maternal Grandmother   . Cancer Paternal Grandmother        unsure -- thinks  breast or ovarian    Allergies  Allergen Reactions  . Cephalosporins Other (See Comments)    Involuntary muscle seizures  . Imitrex [Sumatriptan] Other (See Comments)    Burning sensation and involuntary muscle contractions  . Hydrocodone Itching and Nausea Only  . Cephalexin Other (See Comments) and Nausea Only    Severe muscle contractions  . Other Other (See Comments)    "Do not want to take steroids due to side effects" that were long lasting     Current Medications:   Current Outpatient Medications:  .  Cholecalciferol (VITAMIN D PO), Take 5,000 Units by mouth daily., Disp: , Rfl:  .  Multiple Vitamin (MULTIVITAMIN) capsule, Take by mouth., Disp: , Rfl:  .  Probiotic Product (PROBIOTIC PO), Take 1 capsule by mouth daily., Disp: , Rfl:  .  TAYTULLA 1-20 MG-MCG(24) CAPS, TK 1 C PO QD, Disp: , Rfl: 5 .  ALPRAZolam (XANAX) 0.5 MG tablet, Take 0.25 mg by mouth at bedtime as needed for anxiety., Disp: , Rfl:    Review of Systems:   Review of Systems  Constitutional: Negative.  Negative for chills, fever, malaise/fatigue and weight loss.  HENT: Positive for ear pain and tinnitus. Negative for hearing loss, sinus pain and sore throat.        Left ear, no drainage.  Eyes: Negative for blurred vision.  Respiratory: Positive for cough. Negative for shortness of breath.   Cardiovascular: Negative for chest pain, palpitations and leg swelling.  Gastrointestinal: Negative.  Negative for abdominal pain, constipation, diarrhea, heartburn, nausea and vomiting.  Genitourinary: Negative for dysuria, frequency and urgency.  Musculoskeletal: Negative for back pain, myalgias and neck pain.  Skin: Negative.  Negative for itching and rash.  Neurological: Negative.  Negative for dizziness, tingling, seizures, loss of consciousness and headaches.  Endo/Heme/Allergies: Negative for polydipsia.  Psychiatric/Behavioral: Negative for depression. The patient is not nervous/anxious.     Vitals:    Vitals:   02/21/17 0939  BP: 124/80  Pulse: (!) 110  Temp: 98.2 F (36.8 C)  TempSrc: Oral  SpO2: 97%  Weight: 158 lb (71.7 kg)  Height: 5' 4.5" (1.638 m)     Body mass index is 26.7 kg/m.  Physical Exam:   Physical Exam  Constitutional: She appears well-developed. She is cooperative.  Non-toxic appearance. She does not have a sickly appearance. She does not appear ill. No distress.  HENT:  Head: Normocephalic and atraumatic.  Right Ear: Tympanic membrane, external ear and ear canal normal. Tympanic membrane is not erythematous, not retracted and not bulging.  Left Ear: External ear and ear canal normal. Tympanic membrane is not erythematous, not retracted and not bulging.  A middle ear effusion is present.  Nose: Mucosal edema and rhinorrhea present. Right sinus exhibits no maxillary sinus tenderness and no frontal sinus tenderness. Left sinus exhibits no maxillary sinus tenderness and no frontal sinus tenderness.  Mouth/Throat: Uvula is midline. No posterior oropharyngeal edema or posterior oropharyngeal erythema.  Slight erythematous abrasions in bilateral nares along septum, close to tip of nose; dried blood present  Eyes: Conjunctivae and lids are normal.  Neck: Trachea normal.  Cardiovascular: Normal rate, regular rhythm, S1 normal, S2 normal and normal heart sounds.  Pulmonary/Chest: Effort normal and breath sounds normal. She has no decreased breath sounds. She has no wheezes. She has no rhonchi. She has no rales.  Lymphadenopathy:    She has no cervical adenopathy.  Neurological: She is alert.  Skin: Skin is warm, dry and intact.  Psychiatric: She has a normal mood and affect. Her speech is normal and behavior is normal.  Nursing note and vitals reviewed.   Assessment and Plan:    Kamy was seen today for establish care and left ear pain.  Diagnoses and all orders for this visit:  Ear pain, left and history of epistaxis No red flags on exam.  She cannot take  systemic steroids as it causes unpleasant side effects. Continue Flonase -- she states that this is not contributing to her nosebleeds. Start Sudafed to help with congestion. May try ibuprofen prn for inflammation and pain, follow package instructions. I recommended keeping her ears covered while in the cold. Reviewed return precautions including fever, SOB, worsening cough or other concerns. We discussed ENT eval for second opinion and she is agreeable to this.   Encounter to establish care She is due for a physical. I recommended that she follow-up with [place] at her convenience for this.  . Reviewed expectations re: course of current medical issues. . Discussed self-management of symptoms. . Outlined signs and symptoms indicating need for more acute intervention. . Patient verbalized understanding and all questions were answered. . See orders for this visit as documented in the electronic medical record. . Patient received an After-Visit Summary.   CMA or LPN served as scribe during this visit. History, Physical, and Plan performed by medical provider. Documentation and orders reviewed and attested to.   [name], PA-C

## 2017-02-22 ENCOUNTER — Encounter: Payer: Self-pay | Admitting: Physician Assistant

## 2017-02-22 ENCOUNTER — Ambulatory Visit (INDEPENDENT_AMBULATORY_CARE_PROVIDER_SITE_OTHER): Payer: BLUE CROSS/BLUE SHIELD | Admitting: Physician Assistant

## 2017-02-22 VITALS — BP 122/80 | HR 101 | Temp 98.3°F | Ht 64.5 in | Wt 158.0 lb

## 2017-02-22 DIAGNOSIS — E559 Vitamin D deficiency, unspecified: Secondary | ICD-10-CM

## 2017-02-22 DIAGNOSIS — R5383 Other fatigue: Secondary | ICD-10-CM | POA: Diagnosis not present

## 2017-02-22 DIAGNOSIS — F329 Major depressive disorder, single episode, unspecified: Secondary | ICD-10-CM

## 2017-02-22 DIAGNOSIS — Z13220 Encounter for screening for lipoid disorders: Secondary | ICD-10-CM

## 2017-02-22 DIAGNOSIS — F32A Depression, unspecified: Secondary | ICD-10-CM | POA: Insufficient documentation

## 2017-02-22 DIAGNOSIS — Z114 Encounter for screening for human immunodeficiency virus [HIV]: Secondary | ICD-10-CM | POA: Diagnosis not present

## 2017-02-22 DIAGNOSIS — F419 Anxiety disorder, unspecified: Secondary | ICD-10-CM | POA: Diagnosis not present

## 2017-02-22 DIAGNOSIS — Z136 Encounter for screening for cardiovascular disorders: Secondary | ICD-10-CM

## 2017-02-22 DIAGNOSIS — Z0001 Encounter for general adult medical examination with abnormal findings: Secondary | ICD-10-CM | POA: Diagnosis not present

## 2017-02-22 NOTE — Patient Instructions (Signed)
It was great to see you again!  Keep working on Administrator, arts. Try to get back into yoga if you can!  Please make an appointment with the lab on your way out. I would like for you to return for lab work within 1-2 weeks. After midnight on the day of the lab draw, please do not eat anything. You may have water, black coffee, unsweetened tea.  Health Maintenance, Female Adopting a healthy lifestyle and getting preventive care can go a long way to promote health and wellness. Talk with your health care provider about what schedule of regular examinations is right for you. This is a good chance for you to check in with your provider about disease prevention and staying healthy. In between checkups, there are plenty of things you can do on your own. Experts have done a lot of research about which lifestyle changes and preventive measures are most likely to keep you healthy. Ask your health care provider for more information. Weight and diet Eat a healthy diet  Be sure to include plenty of vegetables, fruits, low-fat dairy products, and lean protein.  Do not eat a lot of foods high in solid fats, added sugars, or salt.  Get regular exercise. This is one of the most important things you can do for your health. ? Most adults should exercise for at least 150 minutes each week. The exercise should increase your heart rate and make you sweat (moderate-intensity exercise). ? Most adults should also do strengthening exercises at least twice a week. This is in addition to the moderate-intensity exercise.  Maintain a healthy weight  Body mass index (BMI) is a measurement that can be used to identify possible weight problems. It estimates body fat based on height and weight. Your health care provider can help determine your BMI and help you achieve or maintain a healthy weight.  For females 75 years of age and older: ? A BMI below 18.5 is considered underweight. ? A BMI of 18.5 to 24.9 is  normal. ? A BMI of 25 to 29.9 is considered overweight. ? A BMI of 30 and above is considered obese.  Watch levels of cholesterol and blood lipids  You should start having your blood tested for lipids and cholesterol at 37 years of age, then have this test every 5 years.  You may need to have your cholesterol levels checked more often if: ? Your lipid or cholesterol levels are high. ? You are older than 37 years of age. ? You are at high risk for heart disease.  Cancer screening Lung Cancer  Lung cancer screening is recommended for adults 60-64 years old who are at high risk for lung cancer because of a history of smoking.  A yearly low-dose CT scan of the lungs is recommended for people who: ? Currently smoke. ? Have quit within the past 15 years. ? Have at least a 30-pack-year history of smoking. A pack year is smoking an average of one pack of cigarettes a day for 1 year.  Yearly screening should continue until it has been 15 years since you quit.  Yearly screening should stop if you develop a health problem that would prevent you from having lung cancer treatment.  Breast Cancer  Practice breast self-awareness. This means understanding how your breasts normally appear and feel.  It also means doing regular breast self-exams. Let your health care provider know about any changes, no matter how small.  If you are in your 16s or  44s, you should have a clinical breast exam (CBE) by a health care provider every 1-3 years as part of a regular health exam.  If you are 6 or older, have a CBE every year. Also consider having a breast X-ray (mammogram) every year.  If you have a family history of breast cancer, talk to your health care provider about genetic screening.  If you are at high risk for breast cancer, talk to your health care provider about having an MRI and a mammogram every year.  Breast cancer gene (BRCA) assessment is recommended for women who have family members  with BRCA-related cancers. BRCA-related cancers include: ? Breast. ? Ovarian. ? Tubal. ? Peritoneal cancers.  Results of the assessment will determine the need for genetic counseling and BRCA1 and BRCA2 testing.  Cervical Cancer Your health care provider may recommend that you be screened regularly for cancer of the pelvic organs (ovaries, uterus, and vagina). This screening involves a pelvic examination, including checking for microscopic changes to the surface of your cervix (Pap test). You may be encouraged to have this screening done every 3 years, beginning at age 19.  For women ages 44-65, health care providers may recommend pelvic exams and Pap testing every 3 years, or they may recommend the Pap and pelvic exam, combined with testing for human papilloma virus (HPV), every 5 years. Some types of HPV increase your risk of cervical cancer. Testing for HPV may also be done on women of any age with unclear Pap test results.  Other health care providers may not recommend any screening for nonpregnant women who are considered low risk for pelvic cancer and who do not have symptoms. Ask your health care provider if a screening pelvic exam is right for you.  If you have had past treatment for cervical cancer or a condition that could lead to cancer, you need Pap tests and screening for cancer for at least 20 years after your treatment. If Pap tests have been discontinued, your risk factors (such as having a new sexual partner) need to be reassessed to determine if screening should resume. Some women have medical problems that increase the chance of getting cervical cancer. In these cases, your health care provider may recommend more frequent screening and Pap tests.  Colorectal Cancer  This type of cancer can be detected and often prevented.  Routine colorectal cancer screening usually begins at 37 years of age and continues through 37 years of age.  Your health care provider may recommend  screening at an earlier age if you have risk factors for colon cancer.  Your health care provider may also recommend using home test kits to check for hidden blood in the stool.  A small camera at the end of a tube can be used to examine your colon directly (sigmoidoscopy or colonoscopy). This is done to check for the earliest forms of colorectal cancer.  Routine screening usually begins at age 24.  Direct examination of the colon should be repeated every 5-10 years through 37 years of age. However, you may need to be screened more often if early forms of precancerous polyps or small growths are found.  Skin Cancer  Check your skin from head to toe regularly.  Tell your health care provider about any new moles or changes in moles, especially if there is a change in a mole's shape or color.  Also tell your health care provider if you have a mole that is larger than the size of a pencil  eraser.  Always use sunscreen. Apply sunscreen liberally and repeatedly throughout the day.  Protect yourself by wearing long sleeves, pants, a wide-brimmed hat, and sunglasses whenever you are outside.  Heart disease, diabetes, and high blood pressure  High blood pressure causes heart disease and increases the risk of stroke. High blood pressure is more likely to develop in: ? People who have blood pressure in the high end of the normal range (130-139/85-89 mm Hg). ? People who are overweight or obese. ? People who are African American.  If you are 51-23 years of age, have your blood pressure checked every 3-5 years. If you are 21 years of age or older, have your blood pressure checked every year. You should have your blood pressure measured twice-once when you are at a hospital or clinic, and once when you are not at a hospital or clinic. Record the average of the two measurements. To check your blood pressure when you are not at a hospital or clinic, you can use: ? An automated blood pressure machine at  a pharmacy. ? A home blood pressure monitor.  If you are between 62 years and 40 years old, ask your health care provider if you should take aspirin to prevent strokes.  Have regular diabetes screenings. This involves taking a blood sample to check your fasting blood sugar level. ? If you are at a normal weight and have a low risk for diabetes, have this test once every three years after 37 years of age. ? If you are overweight and have a high risk for diabetes, consider being tested at a younger age or more often. Preventing infection Hepatitis B  If you have a higher risk for hepatitis B, you should be screened for this virus. You are considered at high risk for hepatitis B if: ? You were born in a country where hepatitis B is common. Ask your health care provider which countries are considered high risk. ? Your parents were born in a high-risk country, and you have not been immunized against hepatitis B (hepatitis B vaccine). ? You have HIV or AIDS. ? You use needles to inject street drugs. ? You live with someone who has hepatitis B. ? You have had sex with someone who has hepatitis B. ? You get hemodialysis treatment. ? You take certain medicines for conditions, including cancer, organ transplantation, and autoimmune conditions.  Hepatitis C  Blood testing is recommended for: ? Everyone born from [birth year] through 1965. ? Anyone with known risk factors for hepatitis C.  Sexually transmitted infections (STIs)  You should be screened for sexually transmitted infections (STIs) including gonorrhea and chlamydia if: ? You are sexually active and are younger than 37 years of age. ? You are older than 37 years of age and your health care provider tells you that you are at risk for this type of infection. ? Your sexual activity has changed since you were last screened and you are at an increased risk for chlamydia or gonorrhea. Ask your health care provider if you are at risk.  If you do  not have HIV, but are at risk, it may be recommended that you take a prescription medicine daily to prevent HIV infection. This is called pre-exposure prophylaxis (PrEP). You are considered at risk if: ? You are sexually active and do not regularly use condoms or know the HIV status of your partner(s). ? You take drugs by injection. ? You are sexually active with a partner who has HIV.  Talk with your health care provider about whether you are at high risk of being infected with HIV. If you choose to begin PrEP, you should first be tested for HIV. You should then be tested every 3 months for as long as you are taking PrEP. Pregnancy  If you are premenopausal and you may become pregnant, ask your health care provider about preconception counseling.  If you may become pregnant, take 400 to 800 micrograms (mcg) of folic acid every day.  If you want to prevent pregnancy, talk to your health care provider about birth control (contraception). Osteoporosis and menopause  Osteoporosis is a disease in which the bones lose minerals and strength with aging. This can result in serious bone fractures. Your risk for osteoporosis can be identified using a bone density scan.  If you are 8 years of age or older, or if you are at risk for osteoporosis and fractures, ask your health care provider if you should be screened.  Ask your health care provider whether you should take a calcium or vitamin D supplement to lower your risk for osteoporosis.  Menopause may have certain physical symptoms and risks.  Hormone replacement therapy may reduce some of these symptoms and risks. Talk to your health care provider about whether hormone replacement therapy is right for you. Follow these instructions at home:  Schedule regular health, dental, and eye exams.  Stay current with your immunizations.  Do not use any tobacco products including cigarettes, chewing tobacco, or electronic cigarettes.  If you are  pregnant, do not drink alcohol.  If you are breastfeeding, limit how much and how often you drink alcohol.  Limit alcohol intake to no more than 1 drink per day for nonpregnant women. One drink equals 12 ounces of beer, 5 ounces of wine, or 1 ounces of hard liquor.  Do not use street drugs.  Do not share needles.  Ask your health care provider for help if you need support or information about quitting drugs.  Tell your health care provider if you often feel depressed.  Tell your health care provider if you have ever been abused or do not feel safe at home. This information is not intended to replace advice given to you by your health care provider. Make sure you discuss any questions you have with your health care provider. Document Released: 07/03/2010 Document Revised: 05/26/2015 Document Reviewed: 09/21/2014 Elsevier Interactive Patient Education  [employer].

## 2017-02-22 NOTE — Progress Notes (Signed)
I acted as a Education administrator for [employer], PA-C [employer], LPN.  Subjective:    Jennifer Moody is a 37 y.o. female and is here for a comprehensive physical exam.  HPI  Health Maintenance Due  Topic Date Due  . HIV Screening  12/31/1995  . PAP SMEAR  12/30/2001    Acute Concerns: Fatigue -- feels more tired lately. She thinks that it might be secondary to changes in her daily routine as she does not have a job and is going to interviews. She has underlying anxiety but does not think that is contributing. She has had recent dietary changes 2/2 her recent mandible surgery but she is getting back into eating the foods that she usually likes. She has had weight gain with recent life stressors and diet changes.  Chronic Issues: Anxiety and depression -- well-controlled per her report. No SI/HI. Has a good support system. Per chart, was prescribed Xanax several years ago. Currently not on any medications. Vit D deficiency -- hx of this. Has been on supplementation in the past, will re-check.  Health Maintenance: Immunizations -- up to date Colonoscopy -- N/A Mammogram -- N/A PAP -- has an appt on Tues with GYN will have pap done there. Bone Density -- N/A Diet -- improving with time due to her healing; trying to switch to sparkling waters; and flavored waters Caffeine intake -- 1 cup of coffee in the morning Sleep habits -- uses [employer] nightly Exercise -- likes to walk outside when the weather is nice, is hoping to pick up yoga again Weight -- Weight: 158 lb (71.7 kg) -- would like to be around 135 lb; gained weight recently with stress Last period -- 02/18/219 Period characteristics -- every 3 months will have 1-2 days of bleeding with minimal cramping Birth control -- Oral contraceptives Taytulla1-20 mg-mcg  Depression screen [facility] 2/9 02/21/2017  Decreased Interest 1  Down, Depressed, Hopeless 0  PHQ - 2 Score 1  Altered sleeping 3  Tired, decreased energy 2  Change in  appetite 1  Feeling bad or failure about yourself  0  Trouble concentrating 1  Moving slowly or fidgety/restless 1  Suicidal thoughts 0  PHQ-9 Score 9  Difficult doing work/chores Somewhat difficult   GAD 7 : Generalized Anxiety Score 02/21/2017  Nervous, Anxious, on Edge 1  Control/stop worrying 1  Worry too much - different things 0  Trouble relaxing 2  Restless 1  Easily annoyed or irritable 1  Afraid - awful might happen 0  Total GAD 7 Score 6    PMHx, SurgHx, SocialHx, Medications, and Allergies were reviewed in the Visit Navigator and updated as appropriate.   Past Medical History:  Diagnosis Date  . Allergy   . Anxiety   . Asthma    seasonal allergies  . Depression   . History of chicken pox   . Migraines      Past Surgical History:  Procedure Laterality Date  . MANDIBLE SURGERY Left 2018   Removal of Benign tumors  . WISDOM TOOTH EXTRACTION Bilateral      Family History  Problem Relation Age of Onset  . Arthritis Mother   . High Cholesterol Mother   . High blood pressure Mother   . High Cholesterol Father   . Hypertension Father   . Birth defects Sister   . Mental retardation Sister   . Arthritis Maternal Grandmother   . Depression Maternal Grandmother   . Heart disease Maternal Grandmother   . Cancer  Paternal Grandmother        unsure -- thinks breast or ovarian    Social History   Tobacco Use  . Smoking status: Never Smoker  . Smokeless tobacco: Never Used  Substance Use Topics  . Alcohol use: Yes    Comment: socially glass of wine  . Drug use: No    Review of Systems:   Review of Systems  Constitutional: Negative.  Negative for chills, fever, malaise/fatigue and weight loss.  HENT: Negative.  Negative for hearing loss, sinus pain and sore throat.   Eyes: Negative.  Negative for blurred vision.  Respiratory: Negative for cough and shortness of breath.        Pt on Sudafed and Flonase for Upper respiratory symptoms. Applying  Bacitracin to both nostrils.  Cardiovascular: Negative.  Negative for chest pain, palpitations and leg swelling.  Gastrointestinal: Negative.  Negative for abdominal pain, constipation, diarrhea, heartburn, nausea and vomiting.  Genitourinary: Negative.  Negative for dysuria, frequency and urgency.  Musculoskeletal: Negative for back pain, myalgias and neck pain.  Skin: Negative.  Negative for itching and rash.  Neurological: Negative.  Negative for dizziness, tingling, seizures, loss of consciousness and headaches.  Endo/Heme/Allergies: Negative.  Negative for polydipsia.  Psychiatric/Behavioral: Negative.  Negative for depression. The patient is not nervous/anxious.     Objective:   BP 122/80 (BP Location: Left Arm, Patient Position: Sitting, Cuff Size: Normal)   Pulse (!) 101   Temp 98.3 F (36.8 C) (Oral)   Ht 5' 4.5" (1.638 m)   Wt 158 lb (71.7 kg)   LMP 02/18/2017   SpO2 96%   BMI 26.70 kg/m   General Appearance:    Alert, cooperative, no distress, appears stated age  Head:    Normocephalic, without obvious abnormality, atraumatic  Eyes:    PERRL, conjunctiva/corneas clear, EOM's intact, fundi    benign, both eyes  Ears:    Normal TM's and external ear canals, both ears  Nose:   Nares normal, septum midline, mucosa normal, no drainage    or sinus tenderness  Throat:   Lips, mucosa, and tongue normal; teeth and gums normal Scar on anterior L cervical area -- very tender to palpation, well-healed without signs of infection  Neck:   Supple, symmetrical, trachea midline, no adenopathy;    thyroid:  no enlargement/tenderness/nodules; no carotid   bruit or JVD  Back:     Symmetric, no curvature, ROM normal, no CVA tenderness  Lungs:     Clear to auscultation bilaterally, respirations unlabored  Chest Wall:    No tenderness or deformity   Heart:    Regular rate and rhythm, S1 and S2 normal, no murmur, rub   or gallop  Breast Exam:    Deferred -- sees ob-gyn  Abdomen:     Soft,  non-tender, bowel sounds active all four quadrants,    no masses, no organomegaly  Genitalia:    Deferred -- sees ob-gyn  Rectal:    Normal tone no masses or tenderness  Extremities:   Extremities normal, atraumatic, no cyanosis or edema  Pulses:   2+ and symmetric all extremities  Skin:   Skin color, texture, turgor normal, no rashes or lesions  Lymph nodes:   Cervical, supraclavicular, and axillary nodes normal  Neurologic:   CNII-XII intact, normal strength, sensation and reflexes    throughout    Assessment/Plan:   Jennifer Moody was seen today for annual exam.  Diagnoses and all orders for this visit:  Encounter for general adult  medical examination with abnormal findings Today patient counseled on age appropriate routine health concerns for screening and prevention, each reviewed and up to date or declined. Immunizations reviewed and up to date or declined. Labs ordered and reviewed. Risk factors for depression reviewed and negative. Hearing function and visual acuity are intact. ADLs screened and addressed as needed. Functional ability and level of safety reviewed and appropriate. Education, counseling and referrals performed based on assessed risks today. Patient provided with a copy of personalized plan for preventive services.  Fatigue, unspecified type Suspect secondary to recent lifestyle changes however will check labs to r/o organic cause. Encouraged balanced meals and more active lifestyle. -     CBC with Differential/Platelet; Future -     Comprehensive metabolic panel; Future -     Vitamin B12; Future -     TSH; Future -     VITAMIN D 25 Hydroxy (Vit-D Deficiency, Fractures); Future  Vitamin D deficiency Hx of this. Will recheck today. -     VITAMIN D 25 Hydroxy (Vit-D Deficiency, Fractures); Future  Anxiety and Depression Currently stable. Denies use of medication or desire to start medication. Denies SI/HI.  Encounter for lipid screening for cardiovascular disease -      Lipid panel; Future  Encounter for screening for HIV -     HIV antibody; Future    Well Adult Exam: Labs ordered: Yes. Patient counseling was done. See below for items discussed. Discussed the patient's BMI. The BMI BMI is not in the acceptable range; BMI management plan is completed Follow up as needed for acute illness.  Patient Counseling:   [x]     Nutrition: Stressed importance of moderation in sodium/caffeine intake, saturated fat and cholesterol, caloric balance, sufficient intake of fresh fruits, vegetables, fiber, calcium, iron, and 1 mg of folate supplement per day (for females capable of pregnancy).   [x]      Stressed the importance of regular exercise.    [x]     Substance Abuse: Discussed cessation/primary prevention of tobacco, alcohol, or other drug use; driving or other dangerous activities under the influence; availability of treatment for abuse.    [x]      Injury prevention: Discussed safety belts, safety helmets, smoke detector, smoking near bedding or upholstery.    [x]      Sexuality: Discussed sexually transmitted diseases, partner selection, use of condoms, avoidance of unintended pregnancy  and contraceptive alternatives.    [x]     Dental health: Discussed importance of regular tooth brushing, flossing, and dental visits.   [x]      Health maintenance and immunizations reviewed. Please refer to Health maintenance section.   CMA or LPN served as scribe during this visit. History, Physical, and Plan performed by medical provider. Documentation and orders reviewed and attested to.  [name], PA-C [place]

## 2017-02-26 DIAGNOSIS — Z6826 Body mass index (BMI) 26.0-26.9, adult: Secondary | ICD-10-CM | POA: Diagnosis not present

## 2017-02-26 DIAGNOSIS — Z113 Encounter for screening for infections with a predominantly sexual mode of transmission: Secondary | ICD-10-CM | POA: Diagnosis not present

## 2017-02-26 DIAGNOSIS — Z130 Encounter for screening for diseases of the blood and blood-forming organs and certain disorders involving the immune mechanism: Secondary | ICD-10-CM | POA: Diagnosis not present

## 2017-02-26 DIAGNOSIS — B373 Candidiasis of vulva and vagina: Secondary | ICD-10-CM | POA: Diagnosis not present

## 2017-02-26 DIAGNOSIS — Z1389 Encounter for screening for other disorder: Secondary | ICD-10-CM | POA: Diagnosis not present

## 2017-02-26 DIAGNOSIS — L293 Anogenital pruritus, unspecified: Secondary | ICD-10-CM | POA: Diagnosis not present

## 2017-02-26 DIAGNOSIS — Z3041 Encounter for surveillance of contraceptive pills: Secondary | ICD-10-CM | POA: Diagnosis not present

## 2017-02-26 DIAGNOSIS — Z124 Encounter for screening for malignant neoplasm of cervix: Secondary | ICD-10-CM | POA: Diagnosis not present

## 2017-02-26 DIAGNOSIS — Z1151 Encounter for screening for human papillomavirus (HPV): Secondary | ICD-10-CM | POA: Diagnosis not present

## 2017-02-26 DIAGNOSIS — Z01419 Encounter for gynecological examination (general) (routine) without abnormal findings: Secondary | ICD-10-CM | POA: Diagnosis not present

## 2017-02-26 DIAGNOSIS — Z13228 Encounter for screening for other metabolic disorders: Secondary | ICD-10-CM | POA: Diagnosis not present

## 2017-02-26 LAB — CBC AND DIFFERENTIAL
HEMATOCRIT: 42 (ref 36–46)
HEMOGLOBIN: 13.9 (ref 12.0–16.0)
NEUTROS ABS: 5
PLATELETS: 476 — AB (ref 150–399)
WBC: 8.1

## 2017-02-26 LAB — HM HIV SCREENING LAB: HM HIV SCREENING: NEGATIVE

## 2017-02-26 LAB — BASIC METABOLIC PANEL
BUN: 8 (ref 4–21)
Creatinine: 0.7 (ref 0.5–1.1)
Glucose: 88
Potassium: 4.3 (ref 3.4–5.3)
SODIUM: 140 (ref 137–147)

## 2017-02-26 LAB — HEPATIC FUNCTION PANEL
ALT: 28 (ref 7–35)
AST: 24 (ref 13–35)
Alkaline Phosphatase: 49 (ref 25–125)
Bilirubin, Total: 0.2

## 2017-02-26 LAB — TSH: TSH: 1.43 (ref 0.41–5.90)

## 2017-02-26 LAB — LIPID PANEL
CHOLESTEROL: 288 — AB (ref 0–200)
HDL: 55 (ref 35–70)
LDL Cholesterol: 193
LDL/HDL RATIO: 3.5
Triglycerides: 201 — AB (ref 40–160)

## 2017-02-26 LAB — VITAMIN D 25 HYDROXY (VIT D DEFICIENCY, FRACTURES): Vit D, 25-Hydroxy: 48.5

## 2017-02-26 LAB — VITAMIN B12: Vitamin B-12: 380

## 2017-02-27 ENCOUNTER — Other Ambulatory Visit: Payer: BLUE CROSS/BLUE SHIELD

## 2017-03-01 DIAGNOSIS — R040 Epistaxis: Secondary | ICD-10-CM | POA: Diagnosis not present

## 2017-03-04 ENCOUNTER — Encounter: Payer: Self-pay | Admitting: Physician Assistant

## 2017-03-05 ENCOUNTER — Encounter: Payer: Self-pay | Admitting: Physician Assistant

## 2017-03-05 DIAGNOSIS — H9319 Tinnitus, unspecified ear: Secondary | ICD-10-CM | POA: Insufficient documentation

## 2017-03-05 DIAGNOSIS — D165 Benign neoplasm of lower jaw bone: Secondary | ICD-10-CM | POA: Insufficient documentation

## 2017-03-05 DIAGNOSIS — M26609 Unspecified temporomandibular joint disorder, unspecified side: Secondary | ICD-10-CM | POA: Insufficient documentation

## 2017-03-08 ENCOUNTER — Telehealth: Payer: Self-pay | Admitting: Physician Assistant

## 2017-03-08 ENCOUNTER — Encounter: Payer: Self-pay | Admitting: Physician Assistant

## 2017-03-08 DIAGNOSIS — E785 Hyperlipidemia, unspecified: Secondary | ICD-10-CM | POA: Insufficient documentation

## 2017-03-08 NOTE — Telephone Encounter (Signed)
I would like to discuss possibly starting a statin for her cholesterol.  I would like for her to come in for a visit if she is agreeable.  [name] PA-C

## 2017-03-08 NOTE — Telephone Encounter (Signed)
Copied from [place] [id number]. Topic: Quick Communication - Lab Results >> Mar 08, 2017 12:24 PM Tye [place] wrote: Pt called to ask about her labs that were done last February and states her cholesterol was high and is wanting know if she needed to come in for a visit

## 2017-03-08 NOTE — Telephone Encounter (Signed)
Please see labs from 2/26 and advise.

## 2017-03-11 NOTE — Telephone Encounter (Signed)
Spoke to pt told her Aldona Bar would like to see her to discuss medication for cholesterol. Pt verbalized understanding and said she will have to come in this week cause her insurance runs out on the 15th. Told her okay, appointment scheduled for Wed 3/13 for 8:30 AM with [facility]. Pt verbalized understanding.

## 2017-03-12 NOTE — Progress Notes (Signed)
Jennifer Moody is a 37 y.o. female is here to discuss: Cholesterol medication  I acted as a Education administrator for [employer], PA-C Jennifer Pickler, LPN  History of Present Illness:   Chief Complaint  Patient presents with  . Discuss Cholesterol    Patient's insurance runs out Friday. Whats options do she have without insurance?    Pt here to discuss elevated cholesterol and treatment. Cholesterol was 288, Triglycerides 201, LDL 193. She has started [employer] since I have last seen her. She has been researching ways to lower cholesterol content of her diet as well. She reports that she has a strong family history of elevated cholesterol and heart disease. She has never been on prescription medication for her cholesterol.   The ASCVD Risk score Jennifer Moody DC Jr., et al., 2013) failed to calculate for the following reasons:   The 2013 ASCVD risk score is only valid for ages 20 to 68   Anxiety -- her one year anniversary is approaching of her divorce. She has also been stressed with finding a job. She has had issues with anxiety in the past, had prn xanax that she takes <1 x a month.   Health Maintenance Due  Topic Date Due  . PAP SMEAR  12/30/2001    Past Medical History:  Diagnosis Date  . Allergy   . Anxiety   . Asthma    seasonal allergies  . Depression   . History of chicken pox   . Hyperlipidemia   . Migraines      Social History   Socioeconomic History  . Marital status: Single    Spouse name: Not on file  . Number of children: Not on file  . Years of education: Not on file  . Highest education level: Not on file  Social Needs  . Financial resource strain: Not on file  . Food insecurity - worry: Not on file  . Food insecurity - inability: Not on file  . Transportation needs - medical: Not on file  . Transportation needs - non-medical: Not on file  Occupational History  . Not on file  Tobacco Use  . Smoking status: Never Smoker  . Smokeless tobacco: Never Used    Substance and Sexual Activity  . Alcohol use: Yes    Comment: socially glass of wine  . Drug use: No  . Sexual activity: No  Other Topics Concern  . 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 MANDIBLE SURGERY Left 2018   Removal of Benign tumors  . WISDOM TOOTH EXTRACTION Bilateral     Family History  Problem Relation Age of Onset  . Arthritis Mother   . High Cholesterol Mother   . High blood pressure Mother   . High Cholesterol Father   . Hypertension Father   . Birth defects Sister   . Mental retardation Sister   . Arthritis Maternal Grandmother   . Depression Maternal Grandmother   . Heart disease Maternal Grandmother   . Cancer Paternal Grandmother        unsure -- thinks breast or ovarian    PMHx, SurgHx, SocialHx, FamHx, Medications, and Allergies were reviewed in the Visit Navigator and updated as appropriate.   Patient Active Problem List  Diagnosis Date Noted  . Hyperlipidemia 03/08/2017  . TMJ dysfunction 03/05/2017  . Tinnitus 03/05/2017  . 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 Anxiety 02/22/2017  . Depression 02/22/2017    Social History   Tobacco Use  . Smoking status: Never Smoker  . Smokeless tobacco: Never Used  Substance Use Topics  . Alcohol use: Yes    Comment: socially glass of wine  . Drug use: No    Current Medications and Allergies:    Current Outpatient Medications:  .  ALPRAZolam (XANAX) 0.5 MG tablet, Take 0.5 tablets (0.25 mg total) by mouth at bedtime as needed for anxiety., Disp: 20 tablet, Rfl: 0 .  Cholecalciferol (VITAMIN D PO), Take 5,000 Units by mouth daily., Disp: , Rfl:  .  Multiple Vitamin (MULTIVITAMIN) capsule, Take by mouth., Disp: , Rfl:  .  Probiotic Product (PROBIOTIC PO), Take 1 capsule by mouth  daily., Disp: , Rfl:  .  Red Yeast Rice 600 MG CAPS, Take 1 capsule by mouth 2 (two) times daily after a meal., Disp: , Rfl:  .  TAYTULLA 1-20 MG-MCG(24) CAPS, TK 1 C PO QD, Disp: , Rfl: 5 .  simvastatin (ZOCOR) 10 MG tablet, Take 1 tablet (10 mg total) by mouth daily., Disp: 90 tablet, Rfl: 1   Allergies  Allergen Reactions  . Cephalosporins Other (See Comments)    Involuntary muscle seizures  . Imitrex [Sumatriptan] Other (See Comments)    Burning sensation and involuntary muscle contractions  . Hydrocodone Itching and Nausea Only  . Cephalexin Other (See Comments) and Nausea Only    Severe muscle contractions  . Latex   . Other Other (See Comments)    "Do not want to take steroids due to side effects" that were long lasting    Review of Systems   ROS  Negative unless otherwise specified per HPI.   Vitals:   Vitals:   03/13/17 0837  BP: 116/78  Pulse: (!) 104  Temp: 98.2 F (36.8 C)  TempSrc: Oral  SpO2: 99%  Weight: 156 lb 6.4 oz (70.9 kg)  Height: 5' 4.5" (1.638 m)     Body mass index is 26.43 kg/m.   Physical Exam:    Physical Exam  Constitutional: She is oriented to person, place, and time. She appears well-developed and well-nourished.  HENT:  Head: Normocephalic and atraumatic.  Eyes: Conjunctivae and EOM are normal.  Neck: Normal range of motion. Neck supple.  Pulmonary/Chest: Effort normal.  Musculoskeletal: Normal range of motion.  Neurological: She is alert and oriented to person, place, and time.  Skin: Skin is warm and dry.  Psychiatric: She has a normal mood and affect. Her behavior is normal. Judgment and thought content normal.     Assessment and Plan:    Jennifer Moody was seen today for discuss cholesterol.  Diagnoses and all orders for this visit:  Mixed hyperlipidemia We reviewed Cholesterol Nutrition Therapy. She has started exercising. I do think she would benefit from starting 10 mg Zocor daily. We reviewed that she should avoid high  fat foods. Follow-up in 6 months (she cannot come in sooner due to insurance issues.) We also reviewed avoiding pregnancy while starting this medication.  Anxiety [name] Controlled Substance Database reviewed today regarding patient. Patient is compliant with [name] regarding pharmacy use and one-prescribing provider. Will prescribe #20 xanax 0.5mg  at this time, I told her that I would not provide more than this for a 44-month period, patient verbalized understanding.  Other orders -     simvastatin (ZOCOR)  10 MG tablet; Take 1 tablet (10 mg total) by mouth daily. -     ALPRAZolam (XANAX) 0.5 MG tablet; Take 0.5 tablets (0.25 mg total) by mouth at bedtime as needed for anxiety.    . Reviewed expectations re: course of current medical issues. . Discussed self-management of symptoms. . Outlined signs and symptoms indicating need for more acute intervention. . Patient verbalized understanding and all questions were answered. . See orders for this visit as documented in the electronic medical record. . Patient received an After Visit Summary.  CMA or LPN served as scribe during this visit. History, Physical, and Plan performed by medical provider. Documentation and orders reviewed and attested to.  [name], PA-C [place], [place] 03/13/2017  Follow-up: No Follow-up on file.

## 2017-03-13 ENCOUNTER — Ambulatory Visit: Payer: BLUE CROSS/BLUE SHIELD | Admitting: Physician Assistant

## 2017-03-13 ENCOUNTER — Encounter: Payer: Self-pay | Admitting: Physician Assistant

## 2017-03-13 VITALS — BP 116/78 | HR 104 | Temp 98.2°F | Ht 64.5 in | Wt 156.4 lb

## 2017-03-13 DIAGNOSIS — E782 Mixed hyperlipidemia: Secondary | ICD-10-CM | POA: Diagnosis not present

## 2017-03-13 MED ORDER — ALPRAZOLAM 0.5 MG PO TABS: 0.2500 mg | ORAL_TABLET | Freq: Every evening | ORAL | 0 refills | Status: AC | PRN

## 2017-03-13 MED ORDER — SIMVASTATIN 10 MG PO TABS: 10.0000 mg | ORAL_TABLET | Freq: Every day | ORAL | 1 refills | Status: DC

## 2017-03-13 NOTE — Patient Instructions (Addendum)
Please start the Zocor 10 mg daily. If you cannot tolerate this medication, let us know. Follow-up in 6 months, as insurance allows.

## 2017-08-28 ENCOUNTER — Telehealth: Payer: Self-pay | Admitting: *Deleted

## 2017-08-28 DIAGNOSIS — E785 Hyperlipidemia, unspecified: Secondary | ICD-10-CM

## 2017-08-28 NOTE — Telephone Encounter (Signed)
Copied from [place] [id number]. Topic: General - Other >> Aug 27, 2017  3:45 PM Yvette Rack wrote: Reason for CRM: Pt request labs for cholesterol. Pt scheduled to come in on 08/30/17.

## 2017-08-30 ENCOUNTER — Other Ambulatory Visit (INDEPENDENT_AMBULATORY_CARE_PROVIDER_SITE_OTHER): Payer: BLUE CROSS/BLUE SHIELD

## 2017-08-30 DIAGNOSIS — Z13220 Encounter for screening for lipoid disorders: Secondary | ICD-10-CM

## 2017-08-30 DIAGNOSIS — Z114 Encounter for screening for human immunodeficiency virus [HIV]: Secondary | ICD-10-CM | POA: Diagnosis not present

## 2017-08-30 DIAGNOSIS — E559 Vitamin D deficiency, unspecified: Secondary | ICD-10-CM | POA: Diagnosis not present

## 2017-08-30 DIAGNOSIS — E785 Hyperlipidemia, unspecified: Secondary | ICD-10-CM

## 2017-08-30 DIAGNOSIS — R5383 Other fatigue: Secondary | ICD-10-CM | POA: Diagnosis not present

## 2017-08-30 DIAGNOSIS — Z136 Encounter for screening for cardiovascular disorders: Secondary | ICD-10-CM

## 2017-08-30 LAB — LIPID PANEL
Cholesterol: 235 mg/dL — ABNORMAL HIGH (ref 0–200)
HDL: 59.9 mg/dL (ref >39.00)
LDL CALC: 147 mg/dL — AB (ref 0–99)
NONHDL: 175.12
Total CHOL/HDL Ratio: 4
Triglycerides: 143 mg/dL (ref 0.0–149.0)
VLDL: 28.6 mg/dL (ref 0.0–40.0)

## 2017-08-30 LAB — COMPREHENSIVE METABOLIC PANEL
ALBUMIN: 4.2 g/dL (ref 3.5–5.2)
ALK PHOS: 43 U/L (ref 39–117)
ALT: 18 U/L (ref 0–35)
AST: 19 U/L (ref 0–37)
BUN: 10 mg/dL (ref 6–23)
CALCIUM: 9.5 mg/dL (ref 8.4–10.5)
CHLORIDE: 107 meq/L (ref 96–112)
CO2: 26 mEq/L (ref 19–32)
CREATININE: 0.78 mg/dL (ref 0.40–1.20)
GFR: 88.49 mL/min (ref >60.00)
Glucose, Bld: 85 mg/dL (ref 70–99)
Potassium: 4 mEq/L (ref 3.5–5.1)
Sodium: 141 mEq/L (ref 135–145)
TOTAL PROTEIN: 7 g/dL (ref 6.0–8.3)
Total Bilirubin: 0.5 mg/dL (ref 0.2–1.2)

## 2017-08-30 LAB — CBC WITH DIFFERENTIAL/PLATELET
BASOS PCT: 1.2 % (ref 0.0–3.0)
Basophils Absolute: 0.1 10*3/uL (ref 0.0–0.1)
EOS ABS: 0.2 10*3/uL (ref 0.0–0.7)
Eosinophils Relative: 2.7 % (ref 0.0–5.0)
HCT: 42 % (ref 36.0–46.0)
Hemoglobin: 14.3 g/dL (ref 12.0–15.0)
LYMPHS PCT: 35.7 % (ref 12.0–46.0)
Lymphs Abs: 2.4 10*3/uL (ref 0.7–4.0)
MCHC: 34.1 g/dL (ref 30.0–36.0)
MCV: 90.7 fl (ref 78.0–100.0)
MONO ABS: 0.4 10*3/uL (ref 0.1–1.0)
Monocytes Relative: 5.3 % (ref 3.0–12.0)
NEUTROS PCT: 55.1 % (ref 43.0–77.0)
Neutro Abs: 3.7 10*3/uL (ref 1.4–7.7)
Platelets: 401 10*3/uL — ABNORMAL HIGH (ref 150.0–400.0)
RBC: 4.64 Mil/uL (ref 3.87–5.11)
RDW: 12.5 % (ref 11.5–15.5)
WBC: 6.7 10*3/uL (ref 4.0–10.5)

## 2017-08-30 LAB — TSH: TSH: 1.63 u[IU]/mL (ref 0.35–4.50)

## 2017-08-30 LAB — VITAMIN B12: Vitamin B-12: 339 pg/mL (ref 211–911)

## 2017-08-30 LAB — VITAMIN D 25 HYDROXY (VIT D DEFICIENCY, FRACTURES): VITD: 52.94 ng/mL (ref 30.00–100.00)

## 2017-08-31 LAB — HIV ANTIBODY (ROUTINE TESTING W REFLEX): HIV 1&2 Ab, 4th Generation: NONREACTIVE

## 2017-09-06 ENCOUNTER — Ambulatory Visit: Payer: BLUE CROSS/BLUE SHIELD | Admitting: Physician Assistant

## 2017-09-06 ENCOUNTER — Encounter: Payer: Self-pay | Admitting: Physician Assistant

## 2017-09-06 VITALS — BP 110/62 | HR 64 | Temp 98.4°F | Ht 65.0 in | Wt 145.0 lb

## 2017-09-06 DIAGNOSIS — R638 Other symptoms and signs concerning food and fluid intake: Secondary | ICD-10-CM | POA: Diagnosis not present

## 2017-09-06 DIAGNOSIS — E785 Hyperlipidemia, unspecified: Secondary | ICD-10-CM

## 2017-09-06 MED ORDER — PHENTERMINE HCL 37.5 MG PO TABS: 37.5000 mg | ORAL_TABLET | Freq: Every day | ORAL | 2 refills | Status: DC

## 2017-09-06 NOTE — Patient Instructions (Signed)
It was great to see you!  Start 1/2 tablet phentermine daily. If causes side effects, you may try to do a 1/4 tablet.   Let's follow-up in 3 months, sooner if you have concerns.  Take care,  [name] PA-C

## 2017-09-06 NOTE — Progress Notes (Signed)
Jennifer Moody is a 37 y.o. female here for a follow up of a pre-existing problem.  SCRIBE STATEMENT I, Lonell Grandchild, acting as a Education administrator for [employer], PA.,have documented all relevant documentation on the behalf of [name], PA,as directed by  [name], PA while in the presence of [name], [place].  History of Present Illness:   Chief Complaint  Patient presents with  . Follow-up    labs     HPI   Patient in office for follow up on labs. She had to stop the Zocor after 6 weeks due to muscle pain. It has been added to her allergy list. She also would like to discus weight loss. She has changed her diet due to cholesterol and surgery on jaw. She is also has started working out five days a week. She has lost 11 pounds from last visit, with a total of 14 inches. Doesn't eat red meat. Mostly vegetarian.   Hyperlipidemia Patient presents for 6 month follow-up of HLD. In Feb 2019 LDL was 193 and total cholesterol was 288. She was started on Zocor 10 mg.  Most recent  Lipid Panel     Component Value Date/Time   CHOL 235 (H) 08/30/2017 0815   TRIG 143.0 08/30/2017 0815   HDL 59.90 08/30/2017 0815   CHOLHDL 4 08/30/2017 0815   VLDL 28.6 08/30/2017 0815   LDLCALC 147 (H) 08/30/2017 0815   Wt Readings from Last 5 Encounters:  09/06/17 145 lb (65.8 kg)  03/13/17 156 lb 6.4 oz (70.9 kg)  02/22/17 158 lb (71.7 kg)  02/21/17 158 lb (71.7 kg)  03/02/15 135 lb (61.2 kg)     Past Medical History:  Diagnosis Date  . Allergy   . Anxiety   . Asthma    seasonal allergies  . Depression   . History of chicken pox   . Hyperlipidemia   . Migraines      Social History   Socioeconomic History  . Marital status: Single    Spouse name: Not on file  . Number of children: Not on file  . Years of education: Not on file  . Highest education level: Not on file  Occupational History  . Not on file  Social Needs  . Financial resource strain: Not on file  . Food  insecurity:    Worry: Not on file    Inability: Not on file  . Transportation needs:    Medical: Not on file    Non-medical: Not on file  Tobacco Use  . Smoking status: Never Smoker  . Smokeless tobacco: Never Used  Substance and Sexual Activity  . Alcohol use: Yes    Comment: socially glass of wine  . Drug use: No  . Sexual activity: Never  Lifestyle  . Physical activity:    Days per week: Not on file    Minutes per session: Not on file  . Stress: Not on file  Relationships  . Social connections:    Talks on phone: Not on file    Gets together: Not on file    Attends religious service: Not on file    Active member of club or organization: Not on file    Attends meetings of clubs or organizations: Not on file    Relationship status: Not on file  . Intimate partner violence:    Fear of current or ex partner: Not on file    Emotionally abused: Not on file    Physically abused: Not on  file    Forced sexual activity: Not on file  Other Topics Concern  . 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 MANDIBLE SURGERY Left 2018   Removal of Benign tumors  . WISDOM TOOTH EXTRACTION Bilateral     Family History  Problem Relation Age of Onset  . Arthritis Mother   . High Cholesterol Mother   . High blood pressure Mother   . High Cholesterol Father   . Hypertension Father   . Birth defects Sister   . Mental retardation Sister   . Arthritis Maternal Grandmother   . Depression Maternal Grandmother   . Heart disease Maternal Grandmother   . Cancer Paternal Grandmother        unsure -- thinks breast or ovarian    Allergies  Allergen Reactions  . Cephalosporins Other (See Comments)    Involuntary muscle seizures  . Imitrex [Sumatriptan] Other (See Comments)    Burning sensation and involuntary muscle  contractions  . Hydrocodone Itching and Nausea Only  . Cephalexin Other (See Comments) and Nausea Only    Severe muscle contractions  . Latex   . Other Other (See Comments)    "Do not want to take steroids due to side effects" that were long lasting  . Simvastatin     Muscle ache     Current Medications:   Current Outpatient Medications:  .  ALPRAZolam (XANAX) 0.5 MG tablet, Take 0.5 tablets (0.25 mg total) by mouth at bedtime as needed for anxiety., Disp: 20 tablet, Rfl: 0 .  Cholecalciferol (VITAMIN D PO), Take 5,000 Units by mouth daily., Disp: , Rfl:  .  Multiple Vitamin (MULTIVITAMIN) capsule, Take by mouth., Disp: , Rfl:  .  Probiotic Product (PROBIOTIC PO), Take 1 capsule by mouth daily., Disp: , Rfl:  .  TAYTULLA 1-20 MG-MCG(24) CAPS, TK 1 C PO QD, Disp: , Rfl: 5 .  phentermine (ADIPEX-P) 37.5 MG tablet, Take 1 tablet (37.5 mg total) by mouth daily before breakfast., Disp: 30 tablet, Rfl: 2   Review of Systems:   ROS Negative unless otherwise specified per HPI.  Vitals:   Vitals:   09/06/17 1543  BP: 110/62  Pulse: 64  Temp: 98.4 F (36.9 C)  TempSrc: Oral  SpO2: 98%  Weight: 145 lb (65.8 kg)  Height: 5\' 5"  (1.651 m)     Body mass index is 24.13 kg/m.  Physical Exam:   Physical Exam  Constitutional: She appears well-developed. She is cooperative.  Non-toxic appearance. She does not have a sickly appearance. She does not appear ill. No distress.  Cardiovascular: Normal rate, regular rhythm, S1 normal, S2 normal, normal heart sounds and normal pulses.  No LE edema  Pulmonary/Chest: Effort normal and breath sounds normal.  Neurological: She is alert. GCS eye subscore is 4. GCS verbal subscore is 5. GCS motor subscore is 6.  Skin: Skin is warm, dry and intact.  Psychiatric: She has a normal mood and affect. Her speech is normal and behavior is normal.  Nursing note and vitals reviewed.   Assessment and Plan:    [name] was seen today for  follow-up.  Diagnoses and all orders for this visit:  Hyperlipidemia, unspecified hyperlipidemia type Congratulated patient on improvement of cholesterol with dietary and exercise efforts. Continue as able. Will hold off  on resuming statin at this time. Re-check lipids in 6 months.  Difficulty maintaining weight She would like to start Phentermine for a few weeks to help with further weight loss of her abdominal fat. She is very sensitive to medications. Instructed her to take 1/2 tablet at first and if well tolerated, after a few weeks, may take a whole tablet. Follow-up in 3 months.  Other orders -     phentermine (ADIPEX-P) 37.5 MG tablet; Take 1 tablet (37.5 mg total) by mouth daily before breakfast.    . Reviewed expectations re: course of current medical issues. . Discussed self-management of symptoms. . Outlined signs and symptoms indicating need for more acute intervention. . Patient verbalized understanding and all questions were answered. . See orders for this visit as documented in the electronic medical record. . Patient received an After-Visit Summary.  CMA or LPN served as scribe during this visit. History, Physical, and Plan performed by medical provider. The above documentation has been reviewed and is accurate and complete.   [name], PA-C

## 2017-10-29 ENCOUNTER — Ambulatory Visit: Payer: BLUE CROSS/BLUE SHIELD | Admitting: Sports Medicine

## 2017-11-05 ENCOUNTER — Ambulatory Visit: Payer: BLUE CROSS/BLUE SHIELD | Admitting: Sports Medicine

## 2017-11-05 ENCOUNTER — Encounter: Payer: Self-pay | Admitting: Sports Medicine

## 2017-11-05 ENCOUNTER — Ambulatory Visit (INDEPENDENT_AMBULATORY_CARE_PROVIDER_SITE_OTHER): Payer: BLUE CROSS/BLUE SHIELD

## 2017-11-05 VITALS — BP 110/72 | HR 98 | Ht 65.0 in | Wt 149.2 lb

## 2017-11-05 DIAGNOSIS — M25562 Pain in left knee: Secondary | ICD-10-CM

## 2017-11-05 DIAGNOSIS — G8929 Other chronic pain: Secondary | ICD-10-CM

## 2017-11-05 DIAGNOSIS — G2589 Other specified extrapyramidal and movement disorders: Secondary | ICD-10-CM | POA: Diagnosis not present

## 2017-11-05 DIAGNOSIS — M25511 Pain in right shoulder: Secondary | ICD-10-CM

## 2017-11-05 DIAGNOSIS — M7632 Iliotibial band syndrome, left leg: Secondary | ICD-10-CM

## 2017-11-05 MED ORDER — IBUPROFEN-FAMOTIDINE 800-26.6 MG PO TABS: 1.0000 | ORAL_TABLET | Freq: Three times a day (TID) | ORAL | 0 refills | Status: AC | PRN

## 2017-11-05 MED ORDER — IBUPROFEN-FAMOTIDINE 800-26.6 MG PO TABS: 1.0000 | ORAL_TABLET | Freq: Three times a day (TID) | ORAL | 2 refills | Status: DC | PRN

## 2017-11-05 NOTE — Progress Notes (Signed)
Jennifer Moody. Jennifer Moody, [place] at [facility] [place] - 37 y.o. female MRN [id number]  Date of birth: 20-Oct-1980  Visit Date: 11/05/2017  PCP: [name], PA   Referred by: [name], [place]   Scribe(s) for today's visit: Wendy Poet, LAT, ATC  SUBJECTIVE:  Jennifer Moody is here for New Patient (Initial Visit) (R shoulder and L IT band)    HPI: Her R shoulder symptoms INITIALLY: Began a couple months ago after starting going to the gym.  She feels like the chest press and shoulder press were 2 irritating exercises.  She reports popping and grinding in her R shoulder. Described as moderate sharp pain w/ the popping and aching after, nonradiating Worsened with R shoulder overhead ROM; chest press, overhead press Improved with nothing noted Additional associated symptoms include: + mechanical symptoms in R shoulder; no radiating pain; + neck pain w/ some intermittent R UE N/T    At this time symptoms are worsening compared to onset  She has been taking IBU for her knees but otherwise is not doing anything to treat her shoulder.  Her B knee (IT band) symptoms INITIALLY: Began 6 weeks ago after starting to train for a 5K.  Initially she was only having pain w/ running but now also has pain w/ walking that she noticed while hiking this weekend. Described as severe sharp, burning pain at it's worst, radiating to L lateral thigh.  R medial knee pain is isolated and non-radiating Worsened with walking stairs or any type of incline/decline Improved with nothing noted Additional associated symptoms include: + mechanical symptoms in B knees; no noted swelling    At this time symptoms are worsening compared to onset  She has been taking IBU and using a counterforce strap for her L IT band.  She's tried KT tape too.  REVIEW OF SYSTEMS: Denies night time disturbances. Denies fevers, chills, or night  sweats. Denies unexplained weight loss. Denies personal history of cancer. Denies changes in bowel or bladder habits. Denies recent unreported falls. Denies new or worsening dyspnea or wheezing. Reports headaches or dizziness. Has hx of migraines. Reports numbness, tingling or weakness  In the extremities - in the R UE Denies dizziness or presyncopal episodes Denies lower extremity edema    HISTORY:  Prior history reviewed and updated per electronic medical record.  Social History   Occupational History  . Not on file  Tobacco Use  . Smoking status: Never Smoker  . Smokeless tobacco: Never Used  Substance and Sexual Activity  . Alcohol use: Yes    Comment: socially glass of wine  . Drug use: No  . 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 Allergy   . Anxiety   . Asthma    seasonal allergies  . Depression   . History of chicken pox   . Hyperlipidemia   . Migraines    Past Surgical History:  Procedure Laterality Date  . MANDIBLE SURGERY Left 2018   Removal of Benign tumors  . WISDOM TOOTH EXTRACTION Bilateral    family history includes Arthritis in her maternal grandmother and mother; Birth defects in her sister; Cancer in her paternal grandmother; Depression in her maternal grandmother; Heart  disease in her maternal grandmother; High Cholesterol in her father and mother; High blood pressure in her mother; Hypertension in her father; Mental retardation in her sister.  DATA OBTAINED & REVIEWED:  No results for input(s): HGBA1C, LABURIC, CREATINE in the last 8760 hours. No problems updated. . 11/05/17 - X-Ray Right shoulder: possible posterior margin defect but minimal .   OBJECTIVE:  VS:  HT:5\' 5"  (165.1 cm)   WT:149 lb 3.2 oz (67.7 kg)  BMI:24.83    BP:110/72  HR:98bpm   TEMP: ( )  RESP:97 %   PHYSICAL EXAM: CONSTITUTIONAL: Well-developed, Well-nourished and In no acute distress PSYCHIATRIC: Alert & appropriately interactive. and Not depressed or anxious appearing. RESPIRATORY: No increased work of breathing and Trachea Midline EYES: Pupils are equal., EOM intact without nystagmus. and No scleral icterus.  VASCULAR EXAM: Warm and well perfused NEURO: unremarkable  MSK Exam: Right Shoulder Exam: Well aligned, no significant deformity. No overlying skin changes. Neck: normal range of motion and supple Non tender to palpation over: Bony Landmarks Axial loading produces: Mild pain and Mild crepitation Drop arm test: negative  Internal Rotation:  ROM: Normal with no pain.   Strength: Normal External Rotation:  ROM: Normal with no pain.   Strength: Normal Hawkins: normal, no pain Neers: normal, no pain Empty Can: normal, no pain Strength: Normal Speed's: normal, no pain Strength: Normal O'Briens: normal, no pain Strength: Normal   Right Knee  Alignment & Contours: normal Skin: No overlying erythema/ecchymosis Effusion: none   Generalized Synovitis: none Knee Tenderness: Lateral as well as painful over Gertie's tubercle. Gait: normal Patellar grind produces: Mild pain and Mild crepitation   RANGE OF MOTION & STRENGTH  EXTENSION: Normal  with no pain.   Strength: Normal FLEXION: Normal with no pain.   Strength: Normal HIP ABDUCTION: Strength: 4/5 Recruitment pattern: TFL predominant   LIGAMENTOUS TESTING  Varus & Valgus Strain: stable to testing Anterior & Posterior Drawer: stable to testing Lachman's: stable to testing   SPECIALITY TESTING:  Patellar Apprehension: normal, no pain Mcmurray's: normal, no pain Thessaly: normal, no pain    ASSESSMENT   1. Right shoulder pain, unspecified chronicity   2. Acute pain of left knee   3. Chronic right shoulder pain   4. Scapular dyskinesis   5. Iliotibial band syndrome of left side      PLAN:  Pertinent additional documentation may be included in corresponding procedure notes, imaging studies, problem based documentation and patient instructions.  Procedures:  . Discussed the foundation of treatment for this condition is physical therapy and/or daily (5-6 days/week) therapeutic exercises, focusing on core strengthening, coordination, neuromuscular control/reeducation.  Therapeutic exercises prescribed per procedure note.  Medications:  Meds ordered this encounter  Medications  . Ibuprofen-Famotidine (DUEXIS) 800-26.6 MG TABS    Sig: Take 1 tablet by mouth 3 (three) times daily as needed. 1 tab po tid X 14 days then 1 tab po tid as needed    Dispense:  90 tablet    Refill:  2    Home Phone      [phone] Mobile          [phone]   . Ibuprofen-Famotidine (DUEXIS) 800-26.6 MG TABS    Sig: Take 1 tablet by mouth 3 (three) times daily as needed for up to 1 day.    Dispense:  9 tablet    Refill:  0   Discussion/Instructions: No problem-specific Assessment & Plan notes found for this encounter.  . Functional shoulder pain with possibility of  underlying labral pathology but minimal. Avoid overhead activity but okay to continue to exercise  . ITB 2o/2 TFL predominat recruitment . Discussed red flag symptoms that warrant earlier emergent evaluation and patient voices understanding. . Activity modifications and the importance of avoiding exacerbating activities (limiting pain to no more than a 4 / 10 during or following activity) recommended and discussed.  Follow-up:  . Return in about 4 weeks (around 12/03/2017).  . If any lack of improvement consider: further diagnostic evaluation with Xrays of the knee and referral to Physical Therapy     CMA/ATC served as scribe during this visit. History, Physical, and Plan performed by medical provider. Documentation and orders reviewed and attested to.      Gerda Diss, [facility] Physician

## 2017-11-05 NOTE — Patient Instructions (Addendum)
Instructions for [place], [place] and [place]:  Your prescription will be filled through a participating [facility] mail order pharmacy.  You will receive a phone call or text from one of the participating pharmacies which can be located in any state in the [place].  You must communicate directly with them to have this medication filled.  When the pharmacy contacts you, they will need your mailing address (for shipment of the medication) andy they will need payment information if you have a copay (typically no more than $10). If you have not heard from them 2-3 days after your appointment with Dr. Paulla Fore, contact [facility] directly at [phone].   Please perform the exercise program that we have prepared for you and gone over in detail on a daily basis.  In addition to the handout you were provided you can access your program through: [email or web address]   Your unique program code is:  [email or web address]  [email or web address]

## 2017-11-05 NOTE — Progress Notes (Signed)
PROCEDURE NOTE: THERAPEUTIC EXERCISES (97110) 15 minutes spent for Therapeutic exercises as below and as referenced in the AVS.  This included exercises focusing on stretching, strengthening, with significant focus on eccentric aspects.   Proper technique shown and discussed handout in great detail with ATC.  All questions were discussed and answered.   Long term goals include an improvement in range of motion, strength, endurance as well as avoiding reinjury. Frequency of visits is one time as determined during today's  office visit. Frequency of exercises to be performed is as per handout.  EXERCISES REVIEWED:  Intrinsic Rotator Cuff Exercises  Scapular Stabilization  Hip ABduction strengthening with focus on Glute Medius Recruitment  IT Band Stretching

## 2017-12-10 ENCOUNTER — Ambulatory Visit: Payer: BLUE CROSS/BLUE SHIELD | Admitting: Sports Medicine

## 2018-01-14 DIAGNOSIS — G43111 Migraine with aura, intractable, with status migrainosus: Secondary | ICD-10-CM | POA: Diagnosis not present

## 2018-01-18 DIAGNOSIS — H52222 Regular astigmatism, left eye: Secondary | ICD-10-CM | POA: Diagnosis not present

## 2018-01-18 DIAGNOSIS — H5203 Hypermetropia, bilateral: Secondary | ICD-10-CM | POA: Diagnosis not present

## 2018-02-24 ENCOUNTER — Ambulatory Visit: Payer: Self-pay | Admitting: *Deleted

## 2018-02-24 NOTE — Telephone Encounter (Signed)
Pt reports swelling behind left ear. Onset Saturday AM, "Woke up with it." States area "Doubled in size by Sunday." Size of 1/2 dollar, oblong in shape. States unable to see well because of hair, but feels "A little" warm to touch. No itching. 7/10 pain with palpation, 5-6/10 other wise. No drainage. Pt does not recall any injury, "May be a spider bite."  Reports feeling "Lethargic" since swelling appeared Saturday. "Been staying in bed."Also states left side of neck and upper back "Feel stiff." Denies fever, rash, no SOB, no earache. States took Ibuprofen 20 minutes ago which has helped with the tenderness and swelling "A little." Pt has appt with Dr. Jerline Pain tomorrow am, made by agent earlier in day. Pt anxious; gave UC as a recommendation, declined. [place] called practice, spoke with [facility]. No availability this afternoon. Pt requesting CB from practice. "Not sure how worried I should be."  Assured pt TN would route to practice for further recommendations. Also made aware PCP S. Worley had availability tomorrow afternoon. Declined, wanted earlier appt.with Dr. Jerline Pain. Care advise given per protocol. CB: [phone]  Reason for Disposition . [1] Swelling is painful to touch AND [2] no fever  Answer Assessment - Initial Assessment Questions 1. APPEARANCE of SWELLING: "What does it look like?" (e.g., lymph node, insect bite, mole)     Unsure, can not see, "Feels a little warm." 2. SIZE: "How large is the swelling?" (inches, cm or compare to coins)     1/2 dollar size, oblong 3. LOCATION: "Where is the swelling located?"     Behind left ear 4. ONSET: "When did the swelling start?"     Saturday 5. PAIN: "Is it painful?" If so, ask: "How much?"     7/10 when touching it. 5-6/10 "pressure" 6. ITCH: "Does it itch?" If so, ask: "How much?"     Itching before lump appeared 7. CAUSE: "What do you think caused the swelling?"     Maybe a bite 8. OTHER SYMPTOMS: "Do you have any other symptoms?" (e.g.,  fever)     Lethargic, left side of neck and shoulder "Stiff."  Protocols used: SKIN LUMP OR LOCALIZED SWELLING-A-AH

## 2018-02-24 NOTE — Telephone Encounter (Signed)
See note

## 2018-02-24 NOTE — Telephone Encounter (Signed)
Patient has an Appt. Scheduled for 02/25/18.Informed patient if symptoms worse go to UC or ER,patient verbalized understanding.

## 2018-02-25 ENCOUNTER — Encounter: Payer: Self-pay | Admitting: Family Medicine

## 2018-02-25 ENCOUNTER — Ambulatory Visit: Payer: BLUE CROSS/BLUE SHIELD | Admitting: Family Medicine

## 2018-02-25 VITALS — BP 116/68 | HR 112 | Temp 98.5°F | Ht 65.0 in | Wt 161.0 lb

## 2018-02-25 DIAGNOSIS — L739 Follicular disorder, unspecified: Secondary | ICD-10-CM | POA: Diagnosis not present

## 2018-02-25 MED ORDER — DICLOFENAC SODIUM 75 MG PO TBEC: 75.0000 mg | DELAYED_RELEASE_TABLET | Freq: Two times a day (BID) | ORAL | 0 refills | Status: DC

## 2018-02-25 MED ORDER — DOXYCYCLINE HYCLATE 100 MG PO TABS: 100.0000 mg | ORAL_TABLET | Freq: Two times a day (BID) | ORAL | 0 refills | Status: DC

## 2018-02-25 NOTE — Progress Notes (Signed)
   Chief Complaint:  Jennifer Moody is a 38 y.o. female who presents for same day appointment with a chief complaint of scalp lesion.   Assessment/Plan:  Folliculitis No red flags.  Start doxycycline 100 mg twice daily 7 days.  Start diclofenac 75 mg twice daily as needed for Moody.  Recommended warm compresses to the area.  Discussed reasons to return to care.  Follow-up as needed.     Subjective:  HPI:  Scalp Lesion, acute problem Started 3 days ago.  Worsened over that time. Located behind left ear. Associated with Moody and swelling. No drainage. Tried ibuprofen which has helped with the Moody a little bit. No fevers or chills. No other obvious alleviating or aggravating factors.   ROS: Per HPI  PMH: She reports that she has never smoked. She has never used smokeless tobacco. She reports current alcohol use. She reports that she does not use drugs.      Objective:  Physical Exam: BP 116/68 (BP Location: Left Arm, Patient Position: Sitting, Cuff Size: Normal)   Pulse (!) 112   Temp 98.5 F (36.9 C) (Oral)   Ht 5\' 5"  (1.651 m)   Wt 161 lb (73 kg)   SpO2 98%   BMI 26.79 kg/m   Gen: NAD, resting comfortably HEENT: TMs and EAC clear bilaterally  Skin: Left mastoid process with a few irritated/inflamed hair follicles.  No areas of induration or fluctuance.     Jennifer Moody. Jennifer Pain, MD 02/25/2018 8:24 AM

## 2018-02-25 NOTE — Patient Instructions (Signed)
It was very nice to see you today!  You probably have an infected hair follicle. Please start the doxycycline and diclofenac.  Use warm compresses to the area.  Let us know if your symptoms worsen or not improve in the next few days.  Take care, Dr Jerline Pain

## 2018-03-04 DIAGNOSIS — Z1389 Encounter for screening for other disorder: Secondary | ICD-10-CM | POA: Diagnosis not present

## 2018-03-04 DIAGNOSIS — Z6826 Body mass index (BMI) 26.0-26.9, adult: Secondary | ICD-10-CM | POA: Diagnosis not present

## 2018-03-04 DIAGNOSIS — Z130 Encounter for screening for diseases of the blood and blood-forming organs and certain disorders involving the immune mechanism: Secondary | ICD-10-CM | POA: Diagnosis not present

## 2018-03-04 DIAGNOSIS — Z01419 Encounter for gynecological examination (general) (routine) without abnormal findings: Secondary | ICD-10-CM | POA: Diagnosis not present

## 2018-04-09 ENCOUNTER — Ambulatory Visit (INDEPENDENT_AMBULATORY_CARE_PROVIDER_SITE_OTHER): Payer: 59 | Admitting: Physician Assistant

## 2018-04-09 ENCOUNTER — Encounter: Payer: Self-pay | Admitting: Physician Assistant

## 2018-04-09 DIAGNOSIS — N946 Dysmenorrhea, unspecified: Secondary | ICD-10-CM | POA: Insufficient documentation

## 2018-04-09 DIAGNOSIS — G43809 Other migraine, not intractable, without status migrainosus: Secondary | ICD-10-CM | POA: Diagnosis not present

## 2018-04-09 MED ORDER — BACLOFEN 10 MG PO TABS: ORAL_TABLET | ORAL | 1 refills | Status: AC

## 2018-04-09 NOTE — Progress Notes (Signed)
Virtual Visit via Video   I connected with Jennifer Moody on 04/09/18 at  1:00 PM EDT by a video enabled telemedicine application and verified that I am speaking with the correct person using two identifiers. Location patient: Home Location provider: [facility], Office Persons participating in the virtual visit: Tyla Cara, [place], [place], [name], [place].   I discussed the limitations of evaluation and management by telemedicine and the availability of in person appointments. The patient expressed understanding and agreed to proceed.  Subjective:   HPI:   Migraines Pt would like a refill on Baclofen 10 mg that she takes for Migraines. She states that she has had this prescription since 2017. She's having issues with her home desk set up and thinks that is contributing. Has migraines roughly monthly. Denies auras.  She has seen a migraine specialist in the past and after many medication trials landed on baclofen. It sometimes helps, sometimes doesn't.  Denies changes in vision, worst HA of life, numbness/tingling.  ROS: See pertinent positives and negatives per HPI.  Patient Active Problem List   Diagnosis Date Noted  . Dysmenorrhea 04/09/2018  . Hyperlipidemia 03/08/2017  . TMJ dysfunction 03/05/2017  . Tinnitus 03/05/2017  . 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 Anxiety 02/22/2017  . Depression 02/22/2017    Social History   Tobacco Use  . Smoking status: Never Smoker  . Smokeless tobacco: Never Used  Substance Use Topics  . Alcohol use: Yes    Comment: socially glass of wine    Current Outpatient Medications:  .  ALPRAZolam (XANAX) 0.5 MG tablet, Take 0.5 tablets (0.25 mg total) by mouth at bedtime as needed for anxiety., Disp: 20 tablet, Rfl: 0 .  Cholecalciferol (VITAMIN D PO), Take 5,000 Units by mouth daily., Disp: , Rfl:  .  COLLAGEN PO, Take 1 tablet by mouth daily., Disp: , Rfl:  .  Dietary Management Product (GABADONE PO), Take 1 tablet by  mouth daily., Disp: , Rfl:  .  Doxylamine Succinate, Sleep, (UNISOM SLEEPTABS PO), Take by mouth., Disp: , Rfl:  .  Elderberry 575 MG/5ML SYRP, Elderberry, Disp: , Rfl:  .  Magnesium 400 MG CAPS, Take 1 capsule by mouth daily., Disp: , Rfl:  .  Multiple Vitamin (MULTIVITAMIN) capsule, Take by mouth., Disp: , Rfl:  .  Multiple Vitamins-Minerals (VITA HAIR PO), Take 1 tablet by mouth daily., Disp: , Rfl:  .  Omega-3 Fatty Acids (FISH OIL) 1000 MG CPDR, Take 1 capsule by mouth daily., Disp: , Rfl:  .  Potassium Acetate CRYS, potassium acetate, Disp: , Rfl:  .  Probiotic Product (PROBIOTIC PO), Take 1 capsule by mouth daily., Disp: , Rfl:  .  TAYTULLA 1-20 MG-MCG(24) CAPS, TK 1 C PO QD, Disp: , Rfl: 5  Allergies  Allergen Reactions  . Cephalosporins Other (See Comments)    Involuntary muscle seizures  . Imitrex [Sumatriptan] Other (See Comments)    Burning sensation and involuntary muscle contractions  . Hydrocodone Itching and Nausea Only  . Cephalexin Other (See Comments) and Nausea Only    Severe muscle contractions  . Latex   . Other Other (See Comments)    "Do not want to take steroids due to side effects" that were long lasting  . Simvastatin     Muscle ache     Objective:   VITALS: Per patient if applicable, see vitals. GENERAL: Alert, appears well and in no acute distress. HEENT: Atraumatic, conjunctiva clear, no obvious abnormalities on inspection of external  nose and ears. NECK: Normal movements of the head and neck. CARDIOPULMONARY: No increased WOB. Speaking in clear sentences. I:E ratio WNL.  MS: Moves all visible extremities without noticeable abnormality. PSYCH: Pleasant and cooperative, well-groomed. Speech normal rate and rhythm. Affect is appropriate. Insight and judgement are appropriate. Attention is focused, linear, and appropriate.  NEURO: CN grossly intact. Oriented as arrived to appointment on time with no prompting. Moves both UE equally.  SKIN: No obvious  lesions, wounds, erythema, or cyanosis noted on face or hands.  Assessment and Plan:   Diagnoses and all orders for this visit:  Other migraine without status migrainosus, not intractable   Mostly controlled, but after discussion she would like to trial increasing to 20 mg baclofen if needed. Follow-up if symptoms persist despite treatment.  . Reviewed expectations re: course of current medical issues. . Discussed self-management of symptoms. . Outlined signs and symptoms indicating need for more acute intervention. . Patient verbalized understanding and all questions were answered. Marland Kitchen Health Maintenance issues including appropriate healthy diet, exercise, and smoking avoidance were discussed with patient. . See orders for this visit as documented in the electronic medical record.  I discussed the assessment and treatment plan with the patient. The patient was provided an opportunity to ask questions and all were answered. The patient agreed with the plan and demonstrated an understanding of the instructions.   The patient was advised to call back or seek an in-person evaluation if the symptoms worsen or if the condition fails to improve as anticipated.  CMA or LPN served as scribe during this visit. History, Physical, and Plan performed by medical provider. The above documentation has been reviewed and is accurate and complete.    [place], [place] 04/09/2018

## 2019-02-03 ENCOUNTER — Ambulatory Visit: Admit: 2019-02-03 | Discharge: 2019-02-03 | Payer: PRIVATE HEALTH INSURANCE | Attending: Obstetrics & Gynecology

## 2019-02-03 DIAGNOSIS — Z01419 Encounter for gynecological examination (general) (routine) without abnormal findings: Secondary | ICD-10-CM

## 2019-02-03 MED ORDER — NORETHINDRONE 1 MG-ETHIN. ESTRADIOL 20 MCG (24)-IRON 75 MG (4) CAPSULE
1 mg-20 mcg (24)/75 mg (4) | PACK | Freq: Every day | ORAL | 4 refills | Status: DC
Start: 2019-02-03 — End: 2019-12-02

## 2019-02-03 NOTE — Progress Notes (Signed)
HPI  Misty Owen is a 39 y.o. female seen for annual GYN exam.  She has vaginisimus.      Past Medical History, Past Surgical History, Family history, Social History, and Medications were all reviewed with the patient today and updated as necessary.     Current Outpatient Medications   Medication Sig   ??? Ascorbate Calcium 814 mg/gram powd Take 1 Tab by mouth.   ??? ALPRAZolam (XANAX) 0.5 mg tablet Take 0.25 mg by mouth daily as needed.   ??? baclofen (LIORESAL) 10 mg tablet May take 1-2 tablets daily as needed for headache or muscle stiffness.   ??? cholecalciferol (VITAMIN D3) (5000 Units/125 mcg) tab tablet Take 5,000 Units by mouth.   ??? magnesium oxide,aspartate,citr (Triple Magnesium Complex) 400 mg magnesium cap Take 1 Cap by mouth daily.   ??? multivitamin (ONE A DAY) tablet Take 1 Tab by mouth daily.   ??? diphenhydramine HCl (UNISOM, DIPHENHYDRAMINE, PO) Take  by mouth.   ??? norethindrone-e.estradioL-iron (Taytulla) 1 mg-20 mcg (24)/75 mg (4) cap Take 1 Tab by mouth daily.     No current facility-administered medications for this visit.      Allergies   Allergen Reactions   ??? Latex, Natural Rubber Other (comments)   ??? Hydrocodone Itching and Nausea Only   ??? Imitrex [Sumatriptan] Myalgia   ??? Simvastatin Other (comments)     Muscle ache      Past Medical History:   Diagnosis Date   ??? Abnormal Pap smear of cervix    ??? High cholesterol    ??? Vaginismus      Past Surgical History:   Procedure Laterality Date   ??? HX COLPOSCOPY     ??? HX OTHER SURGICAL      jaw bone removed     Family History   Problem Relation Age of Onset   ??? Hypertension Mother    ??? High Cholesterol Mother    ??? Other Mother         cyst on brain   ??? Arthritis-rheumatoid Mother    ??? Hypertension Father    ??? High Cholesterol Father       Social History     Tobacco Use   ??? Smoking status: Never Smoker   ??? Smokeless tobacco: Never Used   Substance Use Topics   ??? Alcohol use: Not Currently       Social History     Substance and Sexual Activity    Sexual Activity Not Currently     OB History   Gravida Para Term Preterm AB Living   0 0 0 0 0 0   SAB TAB Ectopic Molar Multiple Live Births   0 0 0 0 0 0       Health Maintenance  Mammogram:   Colonoscopy:   Bone Density:  Pap smear:       Review of Systems  General: Not Present- Chills, Fever, Fatigue, Insomnia, Hot flashes/Night sweats, Weight gain  Skin: Not Present- Bruising, Change in Wart/Mole, Excessive Sweating, Itching, Nail Changes, New Lesions, Rash, Skin Color Changes and Ulcer.  HEENT: Not Present- Headache, Blurred Vision, Double Vision, Glaucoma, Visual Disturbances, Hearing Loss, Ringing in the Ears, Vertigo, Nose Bleed, Bleeding Gums, Hoarseness and Sore Throat.  Neck: Not Present- Neck Pain and Neck Swelling.  Respiratory: Not Present- Cough, Difficulty Breathing and Difficulty Breathing on Exertion.  Breast: Not Present- Breast Mass, Breast Pain, Breast Swelling, Nipple Discharge, Nipple Pain, Recent Breast Size Changes and Skin Changes.  Cardiovascular: Not Present- Abnormal Blood Pressure, Chest Pain, Edema, Fainting / Blacking Out, Palpitations, Shortness of Breath and Swelling of Extremities.  Gastrointestinal: Not Present- Abdominal Pain, Abdominal Swelling, Bloating, Change in Bowel Habits, Constipation, Diarrhea, Difficulty Swallowing, Gets full quickly at meals, Nausea, Rectal Bleeding and Vomiting.  Female Genitourinary: Not Present- Dysmenorrhea, Dyspareunia, Decreased libido, Excessive Menstrual Bleeding, Menstrual Irregularities, Pelvic Pain, Urinary Complaints, Vaginal Discharge, Vaginal itching/burning, Vaginal odor  Musculoskeletal: Not Present- Joint Pain and Muscle Pain.  Neurological: Not Present- Dizziness, Fainting, Headaches and Seizures.  Psychiatric: Not Present- Anxiety, Depression, Mood changes and Panic Attacks.  Endocrine: Not Present- Appetite Changes, Cold Intolerance, Excessive Thirst, Excessive Urination and Heat Intolerance.   Hematology: Not Present- Abnormal Bleeding, Easy Bruising and Enlarged Lymph Nodes.         PHYSICAL EXAM:     Visit Vitals  BP 124/86 (BP Patient Position: At rest)   Ht 5' 4.96" (1.65 m)   Wt 150 lb (68 kg)   LMP 01/20/2019   BMI 24.99 kg/m??     Physical Exam   General   Mental Status - Alert. General Appearance - Cooperative.     Integumentary   General Characteristics: Overall examination of the patient's skin reveals - no rashes and no suspicious lesions.     Head and Neck  Head - normocephalic, atraumatic with no lesions or palpable masses.   Neck Note: Normal   Thyroid   Gland Characteristics - normal size and consistency and no palpable nodules.     Chest and Lung Exam   Chest and lung exam reveals - on auscultation, normal breath sounds, no adventitious sounds and normal vocal resonance.     Breast   Breast - Left - Normal. Right - Normal.     Cardiovascular   Cardiovascular examination reveals - normal heart sounds, regular rate and rhythm with no murmurs.     Abdomen   Inspection: - Inspection Normal.   Palpation/Percussion: Palpation and Percussion of the abdomen reveal - Non Tender, No Rebound tenderness, No Rigidity (guarding), No hepatosplenomegaly, No Palpable abdominal masses and Soft.   Auscultation: Auscultation of the abdomen reveals - Bowel sounds normal.     Female Genitourinary     External Genitalia   Vulva: - Normal. Perineum - Normal. Bartholin's Gland - Bilateral - Normal. Clitoris - Normal.   Introitus: Characteristics - Normal.   Urethra: Characteristics - Normal.     Speculum & Bimanual   Vagina: Vaginal Mucosa - Normal.   Vaginal Wall: - Normal.   Vaginal Lesions - None.   Cervix: Characteristics - Normal.   Uterus: Characteristics - Normal.   Adnexa: - Normal.   Bladder - Normal.     Peripheral Vascular   Normal    Neuropsychiatric    Examination of related systems reveals - The patient is well-nourished and well-groomed. Mental status exam performed with findings of - Oriented X3 with appropriate mood and affect.     Musculoskeletal  Normal      General Lymphatics  Normal              Medical problems and test results were reviewed with the patient today.     ASSESSMENT and PLAN    Diagnoses and all orders for this visit:    1. Encounter for well woman exam with routine gynecological exam    2. Routine cervical smear  -     PAP IG, RFX APTIMA HPV ASCUS ([id number])    3. General counseling and  advice for contraceptive management  -     norethindrone-e.estradioL-iron (Taytulla) 1 mg-20 mcg (24)/75 mg (4) cap; Take 1 Tab by mouth daily.        Follow-up and Dispositions    ?? Return for AE made for 2022.           Carney Harder, MD  02/03/2019

## 2019-02-03 NOTE — Progress Notes (Signed)
Chaperone for Intimate Exam     Chaperone was offer accepted as part of the rooming process    Chaperone: Schneur Crowson, MA

## 2019-02-05 LAB — PAP IG, RFX APTIMA HPV ASCUS (507800): .: 0

## 2019-06-03 ENCOUNTER — Ambulatory Visit: Admit: 2019-06-03 | Discharge: 2019-06-03 | Payer: PRIVATE HEALTH INSURANCE | Attending: Obstetrics & Gynecology

## 2019-06-03 DIAGNOSIS — R350 Frequency of micturition: Secondary | ICD-10-CM

## 2019-06-03 MED ORDER — CITALOPRAM 20 MG TAB
20 mg | ORAL_TABLET | Freq: Every day | ORAL | 1 refills | Status: DC
Start: 2019-06-03 — End: 2019-07-17

## 2019-06-03 MED ORDER — ALPRAZOLAM 0.5 MG TAB
0.5 mg | ORAL_TABLET | Freq: Every day | ORAL | 1 refills | Status: DC
Start: 2019-06-03 — End: 2019-07-17

## 2019-06-03 NOTE — Progress Notes (Signed)
The patient is here for  problems including mood/fam planning    HISTORY:      Patient's last menstrual period was 03/03/2019.SEE BELOW      Current Outpatient Medications on File Prior to Visit   Medication Sig Dispense Refill   ??? Ascorbate Calcium 814 mg/gram powd Take 1 Tab by mouth.     ??? ALPRAZolam (XANAX) 0.5 mg tablet Take 0.25 mg by mouth daily as needed.     ??? baclofen (LIORESAL) 10 mg tablet May take 1-2 tablets daily as needed for headache or muscle stiffness.     ??? cholecalciferol (VITAMIN D3) (5000 Units/125 mcg) tab tablet Take 5,000 Units by mouth.     ??? multivitamin (ONE A DAY) tablet Take 1 Tab by mouth daily.     ??? diphenhydramine HCl (UNISOM, DIPHENHYDRAMINE, PO) Take  by mouth.     ??? norethindrone-e.estradioL-iron (Taytulla) 1 mg-20 mcg (24)/75 mg (4) cap Take 1 Tab by mouth daily. 3 Package 4   ??? magnesium oxide,aspartate,citr (Triple Magnesium Complex) 400 mg magnesium cap Take 1 Cap by mouth daily.       No current facility-administered medications on file prior to visit.   SEE LIST    ROS:      PHYSICAL EXAM:  Blood pressure 128/80, weight 155 lb (70.3 kg), last menstrual period 03/03/2019.    The patient appears well, alert, oriented x 3, in no distress.  Lungs are clear. Heart RRR, no murmurs. Abdomen soft without tenderness, guarding, mass or organomegaly.  Pelvic: normal external genitalia, vulva, vagina, cervix, uterus and adnexa.    ASSESSMENT:DIAGNOSIS DISCUSSED INCLUDING DIFFERENTIALmood fam planning  score    PLAN:    Orders Placed This Encounter   ??? CULTURE, URINE     Complex high risk decision making many questions answered history reviewed precharting done required 30 minute of time discussing a vaiety of problems  goals are set  follow up planned

## 2019-06-05 LAB — CULTURE, URINE

## 2019-07-17 ENCOUNTER — Ambulatory Visit: Admit: 2019-07-17 | Discharge: 2019-07-17 | Payer: PRIVATE HEALTH INSURANCE | Attending: Obstetrics & Gynecology

## 2019-07-17 DIAGNOSIS — R4586 Emotional lability: Secondary | ICD-10-CM

## 2019-07-17 MED ORDER — ALPRAZOLAM 0.5 MG TAB
0.5 mg | ORAL_TABLET | Freq: Every day | ORAL | 0 refills | Status: DC
Start: 2019-07-17 — End: 2019-12-02

## 2019-07-17 MED ORDER — CITALOPRAM 20 MG TAB
20 mg | ORAL_TABLET | Freq: Every day | ORAL | 1 refills | Status: DC
Start: 2019-07-17 — End: 2019-12-02

## 2019-07-21 ENCOUNTER — Inpatient Hospital Stay: Admit: 2019-07-21 | Payer: PRIVATE HEALTH INSURANCE

## 2019-07-21 DIAGNOSIS — M542 Cervicalgia: Secondary | ICD-10-CM

## 2019-07-28 ENCOUNTER — Inpatient Hospital Stay: Admit: 2019-07-28 | Payer: PRIVATE HEALTH INSURANCE

## 2019-08-04 ENCOUNTER — Inpatient Hospital Stay: Admit: 2019-08-04 | Payer: PRIVATE HEALTH INSURANCE

## 2019-08-04 DIAGNOSIS — M545 Low back pain: Secondary | ICD-10-CM

## 2019-11-20 ENCOUNTER — Encounter: Attending: Obstetrics & Gynecology

## 2019-12-02 ENCOUNTER — Ambulatory Visit: Admit: 2019-12-02 | Discharge: 2019-12-02 | Payer: PRIVATE HEALTH INSURANCE | Attending: Obstetrics & Gynecology

## 2019-12-02 DIAGNOSIS — R4586 Emotional lability: Secondary | ICD-10-CM

## 2019-12-02 MED ORDER — CITALOPRAM 20 MG TAB
20 mg | ORAL_TABLET | Freq: Every day | ORAL | 1 refills | Status: DC
Start: 2019-12-02 — End: 2020-02-24

## 2019-12-02 MED ORDER — NORETHINDRONE 1 MG-ETHIN. ESTRADIOL 20 MCG (24)-IRON 75 MG (4) CAPSULE
1 mg-20 mcg (24)/75 mg (4) | Freq: Every day | ORAL | 1 refills | Status: DC
Start: 2019-12-02 — End: 2020-02-24

## 2019-12-02 MED ORDER — ALPRAZOLAM 0.5 MG TAB
0.5 mg | ORAL_TABLET | Freq: Every day | ORAL | 0 refills | Status: DC
Start: 2019-12-02 — End: 2020-02-24

## 2020-02-24 ENCOUNTER — Ambulatory Visit: Admit: 2020-02-24 | Discharge: 2020-02-24 | Payer: PRIVATE HEALTH INSURANCE | Attending: Obstetrics & Gynecology

## 2020-02-24 DIAGNOSIS — Z01419 Encounter for gynecological examination (general) (routine) without abnormal findings: Secondary | ICD-10-CM

## 2020-02-24 MED ORDER — NORETHINDRONE 1 MG-ETHIN. ESTRADIOL 20 MCG (24)-IRON 75 MG (4) CAPSULE
1 mg-20 mcg (24)/75 mg (4) | Freq: Every day | ORAL | 1 refills | Status: AC
Start: 2020-02-24 — End: ?

## 2020-02-24 MED ORDER — METRONIDAZOLE 0.75 % VAGINAL GEL
0.75 % (37.5mg/5 gram) | Freq: Every day | VAGINAL | 3 refills | Status: AC
Start: 2020-02-24 — End: 2020-02-29

## 2020-02-24 MED ORDER — BACLOFEN 10 MG TAB
10 mg | ORAL_TABLET | ORAL | 1 refills | Status: AC
Start: 2020-02-24 — End: ?

## 2020-02-24 MED ORDER — ALPRAZOLAM 0.5 MG TAB
0.5 mg | ORAL_TABLET | Freq: Every day | ORAL | 0 refills | Status: AC
Start: 2020-02-24 — End: ?

## 2020-02-24 MED ORDER — CITALOPRAM 20 MG TAB
20 mg | ORAL_TABLET | Freq: Every day | ORAL | 1 refills | Status: AC
Start: 2020-02-24 — End: ?

## 2020-02-29 LAB — PAP IG, RFX APTIMA HPV ASCUS (507800): .: 0

## 2020-03-15 ENCOUNTER — Other Ambulatory Visit: Admit: 2020-03-15 | Discharge: 2020-03-15 | Payer: PRIVATE HEALTH INSURANCE

## 2020-03-15 DIAGNOSIS — Z23 Encounter for immunization: Secondary | ICD-10-CM

## 2020-06-22 ENCOUNTER — Encounter: Attending: Obstetrics & Gynecology

## 2020-09-19 IMAGING — DX DG SHOULDER 2+V*R*
3 series · 3 of 3 positions shown · non-contrast
Comparison: None.

CLINICAL DATA: RIGHT shoulder pain for several months. No known
injury. Initial encounter.

EXAM:
RIGHT SHOULDER - 2+ VIEW

[shoulder grashey ap]
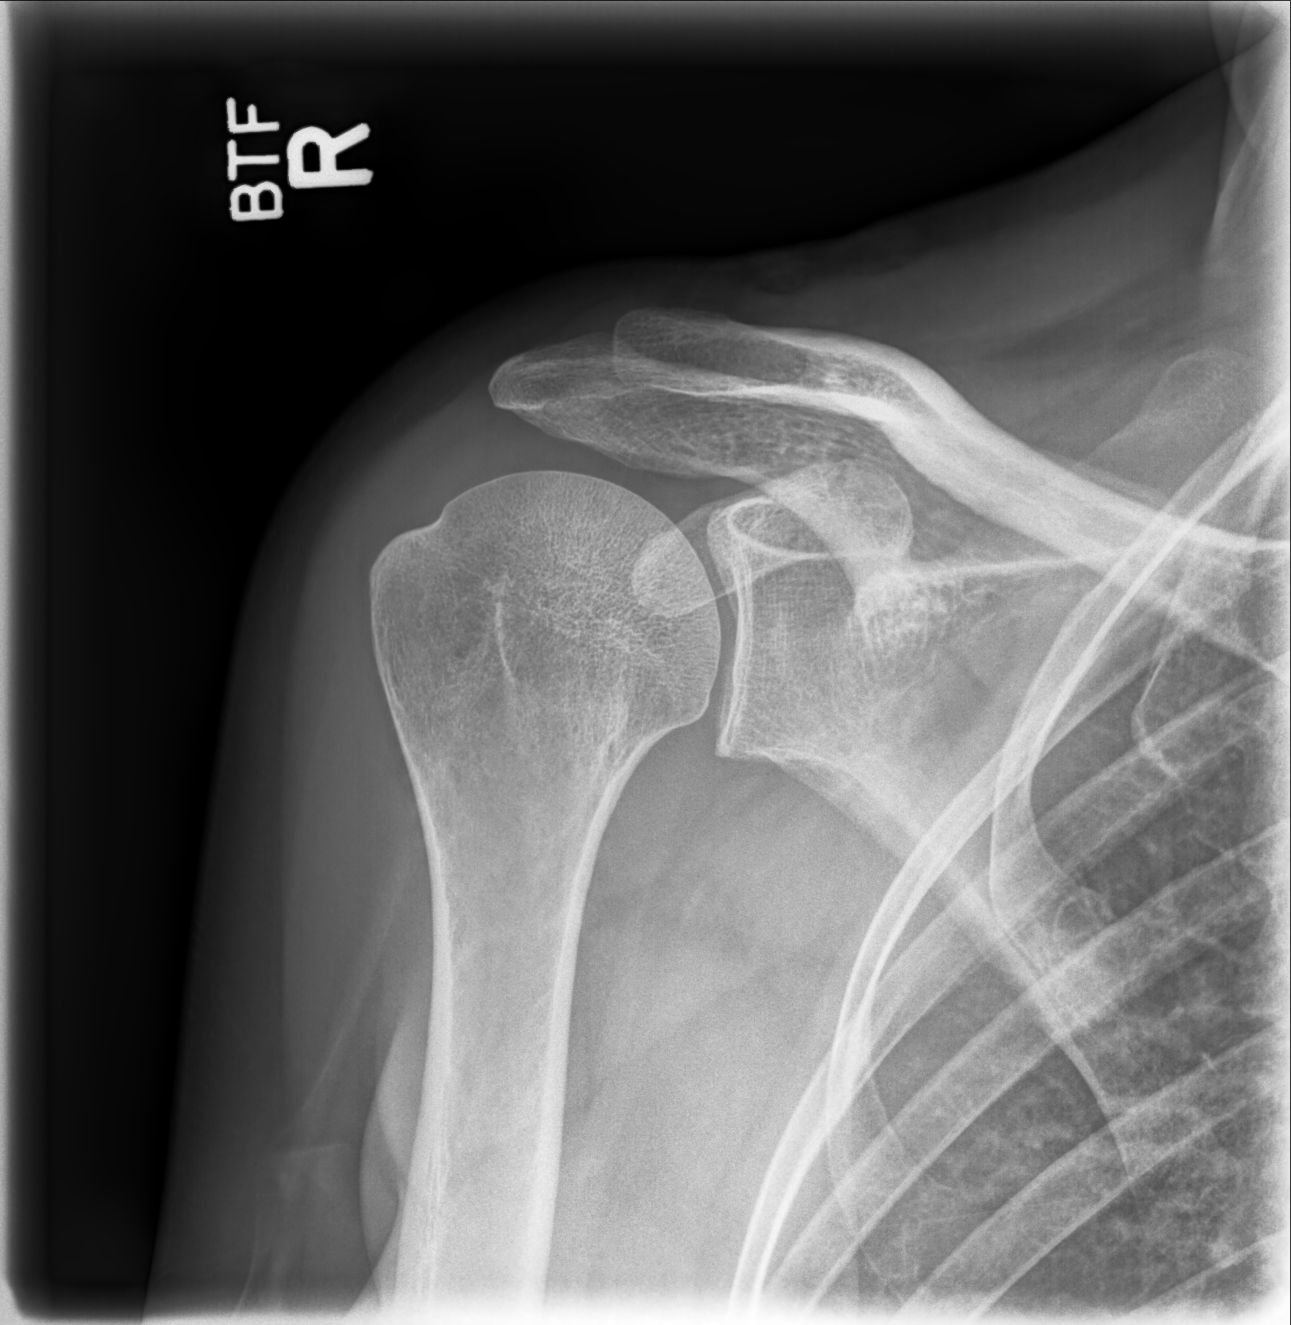

[shoulder y view]
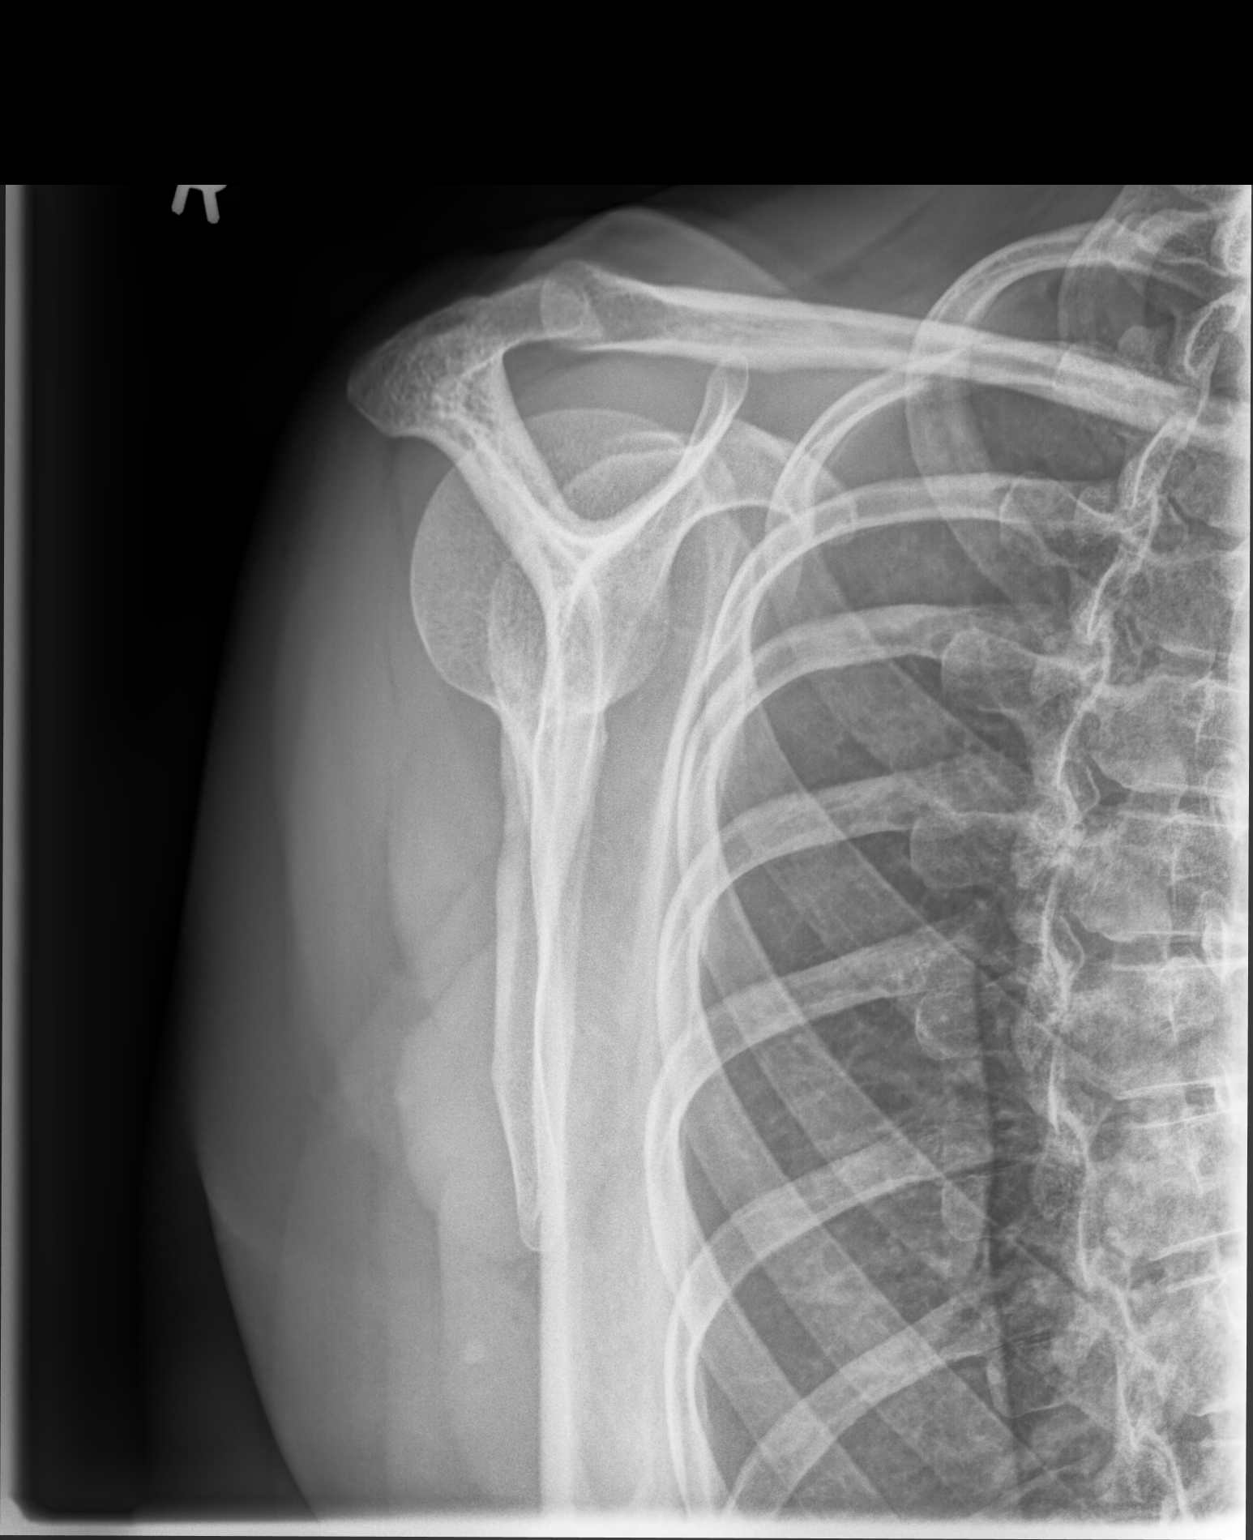

[shoulder axial]
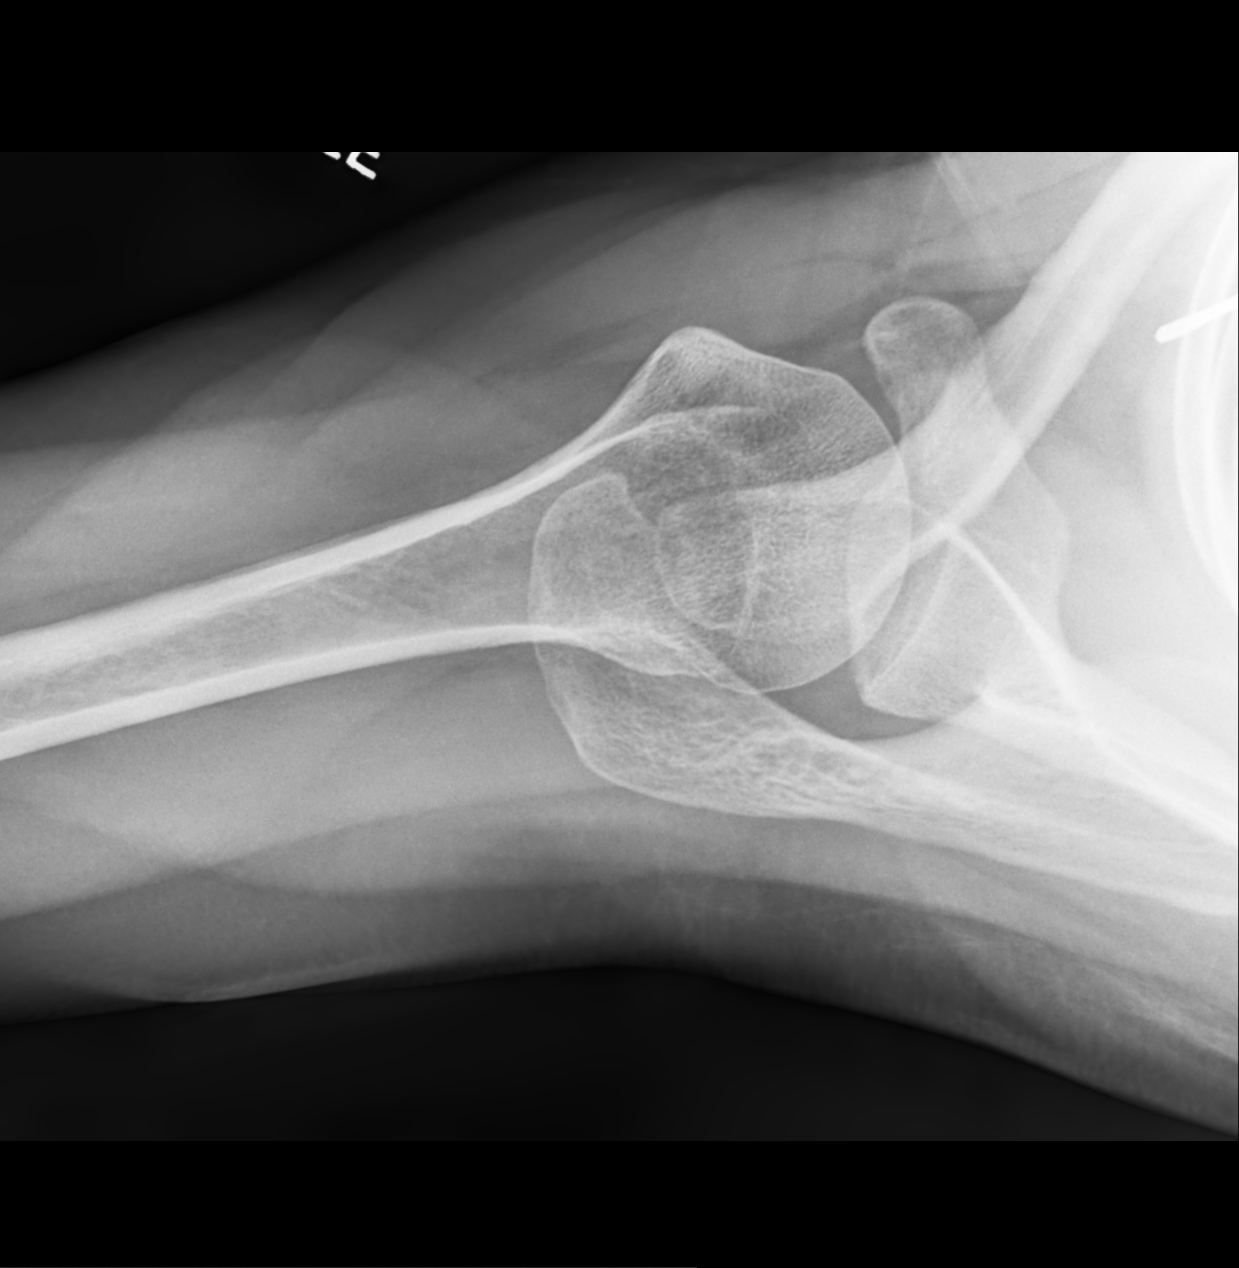

[3 of 3 positions shown; findings below may reference images not displayed]

FINDINGS: There is no evidence of fracture or dislocation. There is no
evidence of arthropathy or other focal bone abnormality. Soft
tissues are unremarkable.
IMPRESSION: Negative.

## 2021-09-25 ENCOUNTER — Encounter: Payer: Self-pay | Admitting: *Deleted

## 2021-12-14 ENCOUNTER — Encounter: Payer: Self-pay | Admitting: *Deleted
